# Patient Record
Sex: Male | Born: 1955 | Race: White | Hispanic: No | Smoking: Never smoker
Health system: Southern US, Community
[De-identification: ages and names within clinical notes are randomized; demographics above are authoritative.]

## PROBLEM LIST (undated history)

## (undated) DIAGNOSIS — I4891 Unspecified atrial fibrillation: Secondary | ICD-10-CM

## (undated) DIAGNOSIS — I10 Essential (primary) hypertension: Secondary | ICD-10-CM

## (undated) DIAGNOSIS — E785 Hyperlipidemia, unspecified: Secondary | ICD-10-CM

## (undated) DIAGNOSIS — G4733 Obstructive sleep apnea (adult) (pediatric): Secondary | ICD-10-CM

## (undated) HISTORY — DX: Hyperlipidemia, unspecified: E78.5

## (undated) HISTORY — DX: Essential (primary) hypertension: I10

## (undated) HISTORY — PX: TONSILLECTOMY: SUR1361

## (undated) HISTORY — DX: Unspecified atrial fibrillation: I48.91

## (undated) HISTORY — DX: Obstructive sleep apnea (adult) (pediatric): G47.33

---

## 2011-05-19 ENCOUNTER — Emergency Department (HOSPITAL_COMMUNITY): Payer: Federal, State, Local not specified - PPO

## 2011-05-19 ENCOUNTER — Encounter: Payer: Self-pay | Admitting: Emergency Medicine

## 2011-05-19 ENCOUNTER — Inpatient Hospital Stay (HOSPITAL_COMMUNITY)
Admission: EM | Admit: 2011-05-19 | Discharge: 2011-05-21 | DRG: 139 | Disposition: A | Discharge status: Home / Self Care | Payer: Federal, State, Local not specified - PPO | Attending: Internal Medicine | Admitting: Internal Medicine

## 2011-05-19 ENCOUNTER — Other Ambulatory Visit: Payer: Self-pay

## 2011-05-19 DIAGNOSIS — I10 Essential (primary) hypertension: Secondary | ICD-10-CM | POA: Diagnosis present

## 2011-05-19 DIAGNOSIS — E7800 Pure hypercholesterolemia, unspecified: Secondary | ICD-10-CM | POA: Diagnosis present

## 2011-05-19 DIAGNOSIS — I4891 Unspecified atrial fibrillation: Secondary | ICD-10-CM | POA: Diagnosis present

## 2011-05-19 DIAGNOSIS — Z7982 Long term (current) use of aspirin: Secondary | ICD-10-CM

## 2011-05-19 DIAGNOSIS — E785 Hyperlipidemia, unspecified: Secondary | ICD-10-CM

## 2011-05-19 DIAGNOSIS — Z79899 Other long term (current) drug therapy: Secondary | ICD-10-CM

## 2011-05-19 LAB — POCT I-STAT TROPONIN I: Troponin i, poc: 0 ng/mL (ref 0.00–0.08)

## 2011-05-19 LAB — BASIC METABOLIC PANEL
CO2: 25 mEq/L (ref 19–32)
Chloride: 101 mEq/L (ref 96–112)
Glucose, Bld: 107 mg/dL — ABNORMAL HIGH (ref 70–99)
Potassium: 4.6 mEq/L (ref 3.5–5.1)
Sodium: 136 mEq/L (ref 135–145)

## 2011-05-19 LAB — DIFFERENTIAL
Eosinophils Relative: 1 % (ref 0–5)
Lymphocytes Relative: 31 % (ref 12–46)
Lymphs Abs: 2.3 10*3/uL (ref 0.7–4.0)
Neutrophils Relative %: 59 % (ref 43–77)

## 2011-05-19 LAB — CBC
MCV: 85.2 fL (ref 78.0–100.0)
Platelets: 251 10*3/uL (ref 150–400)
RBC: 4.88 MIL/uL (ref 4.22–5.81)
WBC: 7.5 10*3/uL (ref 4.0–10.5)

## 2011-05-19 MED ORDER — LISINOPRIL 20 MG PO TABS
20.0000 mg | ORAL_TABLET | Freq: Every day | ORAL | Status: DC
  Administered 2011-05-19 – 2011-05-21 (×3): 20 mg via ORAL
  Filled 2011-05-19 (×4): qty 1

## 2011-05-19 MED ORDER — SODIUM CHLORIDE 0.9 % IV BOLUS (SEPSIS)
500.0000 mL | INTRAVENOUS | Status: AC
  Administered 2011-05-19: 1000 mL via INTRAVENOUS

## 2011-05-19 MED ORDER — NITROGLYCERIN 0.4 MG SL SUBL: 0.4000 mg | SUBLINGUAL_TABLET | SUBLINGUAL | Status: DC | PRN

## 2011-05-19 MED ORDER — SODIUM CHLORIDE 0.9 % IJ SOLN
3.0000 mL | Freq: Two times a day (BID) | INTRAMUSCULAR | Status: DC
  Administered 2011-05-19 – 2011-05-21 (×3): 3 mL via INTRAVENOUS

## 2011-05-19 MED ORDER — HYDROCHLOROTHIAZIDE 12.5 MG PO CAPS
12.5000 mg | ORAL_CAPSULE | Freq: Every day | ORAL | Status: DC
  Administered 2011-05-19 – 2011-05-21 (×3): 12.5 mg via ORAL
  Filled 2011-05-19 (×4): qty 1

## 2011-05-19 MED ORDER — DILTIAZEM HCL 60 MG PO TABS
60.0000 mg | ORAL_TABLET | Freq: Four times a day (QID) | ORAL | Status: DC
  Administered 2011-05-20 – 2011-05-21 (×7): 60 mg via ORAL
  Filled 2011-05-19 (×12): qty 1

## 2011-05-19 MED ORDER — ACETAMINOPHEN 325 MG PO TABS: 650.0000 mg | ORAL_TABLET | ORAL | Status: DC | PRN

## 2011-05-19 MED ORDER — DILTIAZEM HCL 60 MG PO TABS
60.0000 mg | ORAL_TABLET | Freq: Once | ORAL | Status: AC
  Administered 2011-05-19: 60 mg via ORAL
  Filled 2011-05-19: qty 1

## 2011-05-19 MED ORDER — HEPARIN SODIUM (PORCINE) 5000 UNIT/ML IJ SOLN
5000.0000 [IU] | Freq: Three times a day (TID) | INTRAMUSCULAR | Status: DC
  Administered 2011-05-20 – 2011-05-21 (×5): 5000 [IU] via SUBCUTANEOUS
  Filled 2011-05-19 (×9): qty 1

## 2011-05-19 MED ORDER — SODIUM CHLORIDE 0.9 % IJ SOLN: 3.0000 mL | INTRAMUSCULAR | Status: DC | PRN

## 2011-05-19 MED ORDER — METOPROLOL TARTRATE 1 MG/ML IV SOLN: 5.0000 mg | INTRAVENOUS | Status: DC | PRN

## 2011-05-19 MED ORDER — DILTIAZEM HCL 25 MG/5ML IV SOLN
15.0000 mg | Freq: Once | INTRAVENOUS | Status: AC
  Administered 2011-05-19: 15 mg via INTRAVENOUS
  Filled 2011-05-19: qty 5

## 2011-05-19 MED ORDER — DILTIAZEM HCL 100 MG IV SOLR
5.0000 mg/h | Freq: Once | INTRAVENOUS | Status: AC
  Administered 2011-05-19: 5 mg/h via INTRAVENOUS
  Filled 2011-05-19: qty 100

## 2011-05-19 MED ORDER — ONDANSETRON HCL 4 MG/2ML IJ SOLN: 4.0000 mg | Freq: Four times a day (QID) | INTRAMUSCULAR | Status: DC | PRN

## 2011-05-19 MED ORDER — ASPIRIN 325 MG PO TABS
325.0000 mg | ORAL_TABLET | Freq: Every day | ORAL | Status: DC
  Administered 2011-05-20 – 2011-05-21 (×3): 325 mg via ORAL
  Filled 2011-05-19 (×4): qty 1

## 2011-05-19 MED ORDER — OFF THE BEAT BOOK
Freq: Once | Status: AC
  Administered 2011-05-19
  Filled 2011-05-19: qty 1

## 2011-05-19 MED ORDER — HEPARIN BOLUS VIA INFUSION
4000.0000 [IU] | Freq: Once | INTRAVENOUS | Status: AC
  Administered 2011-05-19: 4000 [IU] via INTRAVENOUS

## 2011-05-19 MED ORDER — HEPARIN (PORCINE) IN NACL 100-0.45 UNIT/ML-% IJ SOLN
1500.0000 [IU]/h | INTRAMUSCULAR | Status: DC
  Filled 2011-05-19 (×2): qty 250

## 2011-05-19 MED ORDER — HEPARIN BOLUS VIA INFUSION
1000.0000 [IU] | INTRAVENOUS | Status: AC
  Administered 2011-05-19: 1000 [IU] via INTRAVENOUS
  Filled 2011-05-19: qty 1000

## 2011-05-19 MED ORDER — HEPARIN SOD (PORCINE) IN D5W 100 UNIT/ML IV SOLN
INTRAVENOUS | Status: AC
  Administered 2011-05-20: 08:00:00
  Filled 2011-05-19: qty 250

## 2011-05-19 NOTE — H&P (Signed)
History and Physical  Patient ID: Charles Mills MRN: [id number], SOB: [birth year] 55 y.o. Date of Encounter: 05/19/2011, 3:53 PM  Primary Physician: Barbera Setters, PA-C @ [facility] at [facility] Primary Cardiologist: New to [facility], Seen by Dr. Gala Romney today.  Chief Complaint: Atrial fibrillation on EKG at PCP Reason for Admission: Atrial fibrillation with RVR  HPI: 55yom with no prior medical history who presented to [facility] ED after an EKG at his primary care office showed atrial fibrillation with RVR.  Last Wednesday he was at the oral surgeon and his BP was found to be elevated. He hasn't seen a medical provider in the last 15 to 20 years, so he made an appointment to be seen at [facility] in [place]. He was seen there this morning and his BP was 170/102 and he was found to have an irregular heart rate on physical exam so an EKG was performed. The EKG revealed atrial fibrillation with RVR, HR 120s, and was sent by ambulance to the [facility] ED.  He denies any palpitations, chest pain, shortness of breath, recent illness, fever, chills, abdominal pain, change in bladder or bowels, weight change, calf pain or swelling or drug use. He is very active and walks/runs ~30miles a day 3-4 times a week as well as plays racquetball 3 times a week. He drinks an occasional margarita on the weekends and drinks two cups of coffee and ~32oz of green tea per day. He traveled by car over thanksgiving to [place], which is a two hour trip. He does snore, but is unsure if he stops breathing.  In the ED his EKG revealed atrial fibrillation with RVR, without acute ischemic changes. His initial troponin was negative, CXR was without acute cardiopulmonary findings, and his electrolytes were within normal limits. He was given a Diltiazem bolus followed by a drip as well as placed on a Heparin drip. He remained asymptomatic and his heart rate was in the 90s-110s.    History reviewed. No  pertinent past medical history.   Surgical History: Tonsillectomy in childhood.  Home Meds: Medication Sig  aspirin 81 MG chewable tablet Chew 81 mg by mouth at bedtime.    Calcium Carbonate (CALCIUM 600 PO) Take 600 mg by mouth 2 (two) times daily.    Cholecalciferol (VITAMIN D) 2000 UNITS CAPS Take 1 capsule by mouth 2 (two) times daily.    Coenzyme Q10 (CO Q 10) 100 MG CAPS Take 100 mg by mouth every morning.    cyanocobalamin 500 MCG tablet Take 500 mcg by mouth every morning.    GLUCOSAMINE PO Take 750 mg by mouth every morning.    Misc Natural Products (GINSENG-SIBERIAN) 648 MG CAPS Take 1 capsule by mouth every morning.    Omega-3 Fatty Acids (FISH OIL) 1200 MG CAPS Take 1 capsule by mouth 2 (two) times daily.    OVER THE COUNTER MEDICATION Place 2 drops into both eyes every morning. Murine Eye Drops   Potassium 99 MG TABS Take 99 mg by mouth at bedtime.    Probiotic Product (PROBIOTIC PO) Take 1 tablet by mouth every morning.    QUERCETIN PO Take 500 mg by mouth every morning.    Saw Palmetto, Serenoa repens, (SAW PALMETTO BERRIES) 540 MG CAPS Take 1 capsule by mouth 2 (two) times daily.    vitamin C (ASCORBIC ACID) 500 MG tablet Take 500 mg by mouth 2 (two) times daily.    vitamin E 400 UNIT capsule Take 400 Units by mouth 2 (two)  times daily.     Allergies: No Known Allergies  History   Social History  . Marital Status: Single   Occupational History  . 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 Smoking status: Never Smoker   . Smokeless tobacco: Not on file  . Alcohol Use: Yes     occasional margarita on the weekends  . Drug Use: No     History reviewed. No pertinent family history.  Review of Systems: General: negative for chills, fever, night sweats or weight changes.  Cardiovascular: negative for chest pain, palpitations, dyspnea on exertion, edema, orthopnea, paroxysmal nocturnal dyspnea or shortness of breath Dermatological: negative for  rash Respiratory: negative for cough or wheezing Urologic: negative for hematuria Abdominal: negative for nausea, vomiting, diarrhea, bright red blood per rectum, melena, or hematemesis Neurologic: negative for visual changes, syncope, or dizziness All other systems reviewed and are otherwise negative except as noted above.  Labs:  Lab Results  Component Value Date   WBC 7.5 05/19/2011   HGB 14.9 05/19/2011   HCT 41.6 05/19/2011   MCV 85.2 05/19/2011   PLT 251 05/19/2011    Lab 05/19/11 1236  NA 136  K 4.6  CL 101  CO2 25  BUN 16  CREATININE 0.92  CALCIUM 9.8  GLUCOSE 107*    05/19/2011 15:39  Troponin i, poc 0.00   Radiology/Studies:  Dg Chest Port 1 View 05/19/2011  Findings: The heart size and mediastinal contours are within normal limits.  Both lungs are clear.  The visualized skeletal structures are unremarkable.  IMPRESSION: No active cardiopulmonary abnormalities.     EKG: 05/19/11 @ 1202 - Atrial fibrillation, 144bpm  Physical Exam: Blood pressure 161/83, pulse 99, temperature 99.1 F (37.3 C), temperature source Oral, resp. rate 18, height 5\' 10"  (1.778 m), weight 110.224 kg (243 lb), SpO2 97.00%. General: Well developed, well nourished, overweight white male, in no acute distress. Head: Normocephalic, atraumatic, sclera non-icteric, no xanthomas, nares are without discharge.  Neck: Supple. Negative for carotid bruits. JVD not elevated. Lungs: Clear bilaterally to auscultation without wheezes, rales, or rhonchi. Breathing is unlabored. Heart: Irregular rate and rhythm with S1 S2. No murmurs, rubs, or gallops appreciated. Abdomen: Soft, non-tender, non-distended with normoactive bowel sounds. No rebound/guarding. No obvious abdominal masses. Msk:  Strength and tone appear normal for age. Extremities: No calf swelling or tenderness. Trace bilateral lower extremity edema. No clubbing or cyanosis. Distal pedal pulses are 2+ and equal bilaterally. Neuro: Alert and oriented X  3. Moves all extremities spontaneously. Psych:  Responds to questions appropriately with a normal affect.    ASSESSMENT AND PLAN:  55yom with no prior medical history who presented to [facility] ED after an EKG at his primary care office showed atrial fibrillation with RVR. He will be admitted for further evaluation and treatment.  1. Atrial Fibrillation w/ RVR: The patient remains asymptomatic and is stable and rate controlled on cardizem. The etiology is likely 2/2 to his HTN and/or questionable sleep apnea.  - The cardizem drip will be switched to oral cardizem.  - Lopressor 5mg  IV PRN HR > 100bpm - His CHADS2 score is 1 (pending A1C and echo to assess LV function). He will need anticoagulation as an outpatient until he is therapeutic for approx 4 weeks, at which time it will be assessed if he needs cardioversion. He wishes to hold off on anticoagulation at this time so he can undergo tooth extraction, after which point he will start Coumadin.  -  Discontinue Heparin drip and increase ASA to 325mg  daily - A 2D echocardiogram will be ordered to assess LV function, valvular dysfunction, and atrial dilatation. - Check TSH, fasting lipids, A1C -  Sleep study as an outpatient is recommended 2. Anticoagulation: As above 3. Hypertension: His blood pressure has been elevated on multiple occasions and is currently 160s/100s.  - Cardizem as above - Add Lisinopril/HCTZ  Signed, HOPE, JESSICA PA-C 05/19/2011, 3:53 PM   Patient seen and examined with [facility]. We discussed all aspects of the encounter. I agree with the assessment and plan as stated above. This is a catch-up note. I saw Mr. Parrillo with Berton Mount at [facility] on 05/19/11. He is an obese male with probable undiagnosed HTN and OSA who presents from urgent care after being found to have asymptomatic AF with RVR and severe HTN when he presented to a dentist office for cracked teeth for which he needs extraction. We discussed the pathophysiology  of AF in detail with him and his family. For now, will focus on HR and BP control of AF. We will check echo and TSH. Once HR and BP controlled he can be discharged. We will hold coumadin for now until he has his dental work and then plan to initiate anti-coagulation with eye towards DC-CV in 4 weeks after therapeutic anti-coagulation. Will need outpatient sleep study.   Daaiyah Baumert BensimhonMD 1:33 AM

## 2011-05-19 NOTE — ED Provider Notes (Signed)
Patient relates he has   a broken tooth and saw an oral surgeon last week. They noted he had hypertension. He states he went to see his primary care doctor today because of the hypertension to be evaluated before surgery. On exam he was noted to have irregular heartbeat an EKG was done showing atrophic relation. Patient denies any chest pain shortness of breath lightheadedness. He states last week however one day he was playing racquetball and felt a little dizzy however because of his broken tooth he also had eaten hardly at all that day. He is unaware of palpitations. He states there's no family history of heart problems. Patient states he feels fine.  Patient is alert and cooperative in no distress. Heart exam shows a regular tachycardia with no gross murmur or gallop heard.  Medical screening examination/treatment/procedure(s) were conducted as a shared visit with non-physician practitioner(s) and myself.  I personally evaluated the patient during the encounter Devoria Albe, MD, Franz Dell, MD 05/19/11 [phone]

## 2011-05-19 NOTE — ED Notes (Signed)
Per EMS went to [facility], after oral surgeon told he had high BP. Was found to be in new onset a fib with RVR. Pt has been asymptomatic.

## 2011-05-19 NOTE — Progress Notes (Signed)
ANTICOAGULATION CONSULT NOTE - Initial Consult  Pharmacy Consult for Heparin Indication: atrial fibrillation  No Known Allergies  Patient Measurements: Height: 5\' 10"  (177.8 cm) Weight: 243 lb (110.224 kg) IBW/kg (Calculated) : 73    Vital Signs: Temp: 99.1 F (37.3 C) (12/03 1206) Temp src: Oral (12/03 1206) BP: 172/94 mmHg (12/03 1206) Pulse Rate: 119  (12/03 1206)  Labs:  Basename 05/19/11 1236  HGB 14.9  HCT 41.6  PLT 251  APTT --  LABPROT --  INR --  HEPARINUNFRC --  CREATININE 0.92  CKTOTAL --  CKMB --  TROPONINI --   Estimated Creatinine Clearance: 112.8 ml/min (by C-G formula based on Cr of 0.92).  Medical History: History reviewed. No pertinent past medical history.  Medications:  Calcium, Vitamin D, Coenzyme Q , glucosamine, ginseng, omega 3 fatty acids, potassium 99mg , probiotic, quercetin, saw palmetto berries, aspirin 81mg  , cyanocobalamin, vitamin C and E.  Assessment: 55yo male with chief complaint of irregular heart rate on EKG at MD's office. No chest pain or SOB.  To start IV heparin infusion for Afib.    Goal of Therapy: Heparin level = 0.3-0.7    Plan:  Heparin bolus 4000 units IV and drip @ 1000 units/hr started in the ED @ 13:51 today. Will give additional 1000 unit IV heparin bolus now and increase heparin drip rate to 1500 units/hr. Check 6hr heparin level and CBC. Daily AM heparin level and CBC while on heparin drip.   Arman Filter 05/19/2011,2:01 PM

## 2011-05-19 NOTE — ED Notes (Signed)
Heparin bolus and infulsion rates verified by Britta Mccreedy, RN.

## 2011-05-19 NOTE — ED Notes (Signed)
States first noticed palpitations last Wednesday.

## 2011-05-19 NOTE — ED Provider Notes (Signed)
See prior note   Ward Givens, MD 05/19/11 [phone]

## 2011-05-19 NOTE — ED Provider Notes (Signed)
History     CSN: [id number] Arrival date & time: 05/19/2011 11:48 AM   First MD Initiated Contact with Patient 05/19/11 1159      Chief Complaint  Patient presents with  . Atrial Fibrillation    irregular heart rate on ekg at his doctor's office, denies cp and sob.   HPI Patient presents to emergency room with complaints of palpitations. Reports that he was sent from his doctor's office for this issue. He was initially evaluated at [facility] family practice after an oral surgeon on that his blood pressure was too high. at that time they did an EKG that showed that he was in atrial fibrillation with RVR. Patient denies any cardiac history. Patient denies any chest pain or shortness of breath.  History reviewed. No pertinent past medical history.  History reviewed. No pertinent past surgical history.  History reviewed. No pertinent family history.  History  Substance Use Topics  . Smoking status: Never Smoker   . Smokeless tobacco: Not on file  . Alcohol Use: Yes     occassionally      Review of Systems  Constitutional: Negative for fever, chills, diaphoresis and appetite change.  HENT: Negative for neck pain.   Eyes: Negative for photophobia and visual disturbance.  Respiratory: Negative for cough, choking, chest tightness, shortness of breath and stridor.   Cardiovascular: Positive for palpitations. Negative for chest pain and leg swelling.  Gastrointestinal: Negative for nausea, vomiting and abdominal pain.  Genitourinary: Negative for flank pain.  Musculoskeletal: Negative for back pain.  Skin: Negative for rash.  Neurological: Negative for weakness and numbness.  All other systems reviewed and are negative.    Allergies  Review of patient's allergies indicates no known allergies.  Home Medications   Current Outpatient Rx  Name Route Sig Dispense Refill  . ASPIRIN 81 MG PO CHEW Oral Chew 81 mg by mouth at bedtime.      Marland Kitchen CALCIUM 600 PO Oral Take 600 mg by  mouth 2 (two) times daily.      Marland Kitchen VITAMIN D 2000 UNITS PO CAPS Oral Take 1 capsule by mouth 2 (two) times daily.      . CO Q 10 100 MG PO CAPS Oral Take 100 mg by mouth every morning.      Marland Kitchen CYANOCOBALAMIN 500 MCG PO TABS Oral Take 500 mcg by mouth every morning.      Marland Kitchen GLUCOSAMINE PO Oral Take 750 mg by mouth every morning.      Marland Kitchen GINSENG-SIBERIAN 648 MG PO CAPS Oral Take 1 capsule by mouth every morning.      Marland Kitchen FISH OIL 1200 MG PO CAPS Oral Take 1 capsule by mouth 2 (two) times daily.      Marland Kitchen OVER THE COUNTER MEDICATION Both Eyes Place 2 drops into both eyes every morning. Murine Eye Drops     . POTASSIUM 99 MG PO TABS Oral Take 99 mg by mouth at bedtime.      Marland Kitchen PROBIOTIC PO Oral Take 1 tablet by mouth every morning.      Marland Kitchen QUERCETIN PO Oral Take 500 mg by mouth every morning.      . SAW PALMETTO BERRIES 540 MG PO CAPS Oral Take 1 capsule by mouth 2 (two) times daily.      Marland Kitchen VITAMIN C 500 MG PO TABS Oral Take 500 mg by mouth 2 (two) times daily.      Marland Kitchen VITAMIN E 400 UNITS PO CAPS Oral Take 400 Units by mouth  2 (two) times daily.        BP 172/94  Pulse 119  Temp(Src) 99.1 F (37.3 C) (Oral)  Resp 18  Ht 5\' 10"  (1.778 m)  Wt 243 lb (110.224 kg)  BMI 34.87 kg/m2  SpO2 99%  Physical Exam  Nursing note and vitals reviewed. Constitutional: He is oriented to person, place, and time. He appears well-developed and well-nourished.  HENT:  Head: Normocephalic and atraumatic.  Eyes: EOM are normal. Pupils are equal, round, and reactive to light.  Neck: Normal range of motion. Neck supple. No JVD present. No tracheal deviation present. No thyromegaly present.  Cardiovascular: Intact distal pulses.  Exam reveals no gallop and no friction rub.   No murmur heard.      Irregular irregular rhythm.  Pulmonary/Chest: Effort normal and breath sounds normal. No stridor. No respiratory distress. He has no wheezes. He has no rales. He exhibits no tenderness.  Abdominal: Soft. Bowel sounds are normal.  He exhibits no distension. There is no tenderness.  Musculoskeletal: Normal range of motion. He exhibits no edema and no tenderness.  Lymphadenopathy:    He has no cervical adenopathy.  Neurological: He is alert and oriented to person, place, and time.  Skin: Skin is warm and dry. No rash noted. No erythema. No pallor.  Psychiatric: He has a normal mood and affect. His behavior is normal. Judgment and thought content normal.    ED Course  Procedures (including critical care time)  Patient seen and evaluated.  VSS reviewed. . Nursing notes reviewed. Discussed with and seen with dr. Lynelle Doctor, attending physician. Initial testing ordered. Will monitor the patient closely. They agree with the treatment plan and diagnosis. Heparin and cardizem started.   Results for orders placed during the hospital encounter of 05/19/11  CBC      Component Value Range   WBC 7.5  4.0 - 10.5 (K/uL)   RBC 4.88  4.22 - 5.81 (MIL/uL)   Hemoglobin 14.9  13.0 - 17.0 (g/dL)   HCT 16.1  09.6 - 04.5 (%)   MCV 85.2  78.0 - 100.0 (fL)   MCH 30.5  26.0 - 34.0 (pg)   MCHC 35.8  30.0 - 36.0 (g/dL)   RDW 40.9  81.1 - 91.4 (%)   Platelets 251  150 - 400 (K/uL)  DIFFERENTIAL      Component Value Range   Neutrophils Relative 59  43 - 77 (%)   Neutro Abs 4.5  1.7 - 7.7 (K/uL)   Lymphocytes Relative 31  12 - 46 (%)   Lymphs Abs 2.3  0.7 - 4.0 (K/uL)   Monocytes Relative 9  3 - 12 (%)   Monocytes Absolute 0.7  0.1 - 1.0 (K/uL)   Eosinophils Relative 1  0 - 5 (%)   Eosinophils Absolute 0.0  0.0 - 0.7 (K/uL)   Basophils Relative 1  0 - 1 (%)   Basophils Absolute 0.1  0.0 - 0.1 (K/uL)  BASIC METABOLIC PANEL      Component Value Range   Sodium 136  135 - 145 (mEq/L)   Potassium 4.6  3.5 - 5.1 (mEq/L)   Chloride 101  96 - 112 (mEq/L)   CO2 25  19 - 32 (mEq/L)   Glucose, Bld 107 (*) 70 - 99 (mg/dL)   BUN 16  6 - 23 (mg/dL)   Creatinine, Ser 7.82  0.50 - 1.35 (mg/dL)   Calcium 9.8  8.4 - 95.6 (mg/dL)   GFR calc non Af  Amer >  90  >90 (mL/min)   GFR calc Af Amer >90  >90 (mL/min)   Dg Chest Port 1 View  05/19/2011  *RADIOLOGY REPORT*  Clinical Data: Shortness of breath  PORTABLE CHEST - 1 VIEW  Comparison: None  Findings: The heart size and mediastinal contours are within normal limits.  Both lungs are clear.  The visualized skeletal structures are unremarkable.  IMPRESSION: No active cardiopulmonary abnormalities.  Original Report Authenticated By: Rosealee Albee, M.D.    3:01 PM discussed with [facility] who will see the patient.    Date: 05/19/2011  Rate: 144  Rhythm: atrial fibrillation and RVR  QRS Axis: normal  Intervals: QT prolonged  ST/T Wave abnormalities: nonspecific ST/T changes  Conduction Disutrbances:none  Narrative Interpretation:   Old EKG Reviewed: none available    MDM  New onset A-Fib     Demetrius Charity, PA 05/19/11 1504

## 2011-05-20 ENCOUNTER — Encounter (HOSPITAL_COMMUNITY): Payer: Self-pay | Admitting: *Deleted

## 2011-05-20 ENCOUNTER — Other Ambulatory Visit: Payer: Self-pay

## 2011-05-20 DIAGNOSIS — I4891 Unspecified atrial fibrillation: Principal | ICD-10-CM

## 2011-05-20 DIAGNOSIS — I369 Nonrheumatic tricuspid valve disorder, unspecified: Secondary | ICD-10-CM

## 2011-05-20 LAB — LIPID PANEL
LDL Cholesterol: 187 mg/dL — ABNORMAL HIGH (ref 0–99)
Triglycerides: 100 mg/dL (ref <150)

## 2011-05-20 LAB — CBC
HCT: 40.7 % (ref 39.0–52.0)
MCHC: 34.6 g/dL (ref 30.0–36.0)
MCV: 86.6 fL (ref 78.0–100.0)
RDW: 12.6 % (ref 11.5–15.5)

## 2011-05-20 LAB — BASIC METABOLIC PANEL
BUN: 14 mg/dL (ref 6–23)
Chloride: 102 mEq/L (ref 96–112)
Creatinine, Ser: 1.06 mg/dL (ref 0.50–1.35)
GFR calc Af Amer: 89 mL/min — ABNORMAL LOW (ref >90)

## 2011-05-20 LAB — TSH: TSH: 1.178 u[IU]/mL (ref 0.350–4.500)

## 2011-05-20 LAB — HEMOGLOBIN A1C: Hgb A1c MFr Bld: 5.6 % (ref <5.7)

## 2011-05-20 MED ORDER — METOPROLOL SUCCINATE ER 25 MG PO TB24
25.0000 mg | ORAL_TABLET | Freq: Every day | ORAL | Status: DC
  Administered 2011-05-20 – 2011-05-21 (×2): 25 mg via ORAL
  Filled 2011-05-20 (×2): qty 1

## 2011-05-20 MED ORDER — ROSUVASTATIN CALCIUM 40 MG PO TABS
40.0000 mg | ORAL_TABLET | Freq: Every day | ORAL | Status: DC
  Administered 2011-05-20 – 2011-05-21 (×2): 40 mg via ORAL
  Filled 2011-05-20 (×2): qty 1

## 2011-05-20 NOTE — Progress Notes (Signed)
*  PRELIMINARY RESULTS* Echocardiogram 2D Echocardiogram has been performed.  Charles Mills [facility] 05/20/2011, 1:53 PM

## 2011-05-20 NOTE — Progress Notes (Signed)
Subjective:  The patient feels well. He was not aware of his fast irregular heart rate yesterday when he went to see his PCP about recently discovered high BP noted by his dentist last week.  No chest pain.  Remains in atrial fib with controlled ventricular response on diltiazem. BP labile and high at times.  Objective:  Vital Signs in the last 24 hours: Temp:  [97.4 F (36.3 C)-98.8 F (37.1 C)] 97.4 F (36.3 C) (12/04 1323) Pulse Rate:  [74-99] 87  (12/04 1323) Resp:  [16-18] 18  (12/04 1323) BP: (118-161)/(76-106) 160/88 mmHg (12/04 1323) SpO2:  [97 %-99 %] 99 % (12/04 1323) Weight:  [238 lb 15.7 oz (108.4 kg)-242 lb 12.8 oz (110.133 kg)] 238 lb 15.7 oz (108.4 kg) (12/04 0500)  Intake/Output from previous day: 12/03 0701 - 12/04 0700 In: 38.4 [I.V.:38.4] Out: -  Intake/Output from this shift: Total I/O In: 480 [P.O.:480] Out: 575 [Urine:575]     . aspirin  325 mg Oral Daily  . diltiazem (CARDIZEM) infusion  5-15 mg/hr Intravenous Once  . diltiazem  60 mg Oral Q6H  . diltiazem  60 mg Oral Once  . heparin subcutaneous  5,000 Units Subcutaneous Q8H  . hydrochlorothiazide  12.5 mg Oral Daily  . lisinopril  20 mg Oral Daily  . off the beat book   Does not apply Once  . sodium chloride  3 mL Intravenous Q12H  . DISCONTD: heparin          . DISCONTD: heparin 1,500 Units/hr (05/19/11 1424)    Physical Exam: The patient appears to be in no distress.  Head and neck exam reveals that the pupils are equal and reactive.  The extraocular movements are full.  There is no scleral icterus.  Mouth and pharynx are benign.  No lymphadenopathy.  No carotid bruits.  The jugular venous pressure is normal.  Thyroid is not enlarged or tender.  Chest is clear to percussion and auscultation.  No rales or rhonchi.  Expansion of the chest is symmetrical.  Heart reveals no abnormal lift or heave.  First and second heart sounds are normal.  There is no murmur gallop rub or click. Rhythm is  irregular.  The abdomen is soft and nontender.  Bowel sounds are normoactive.  There is no hepatosplenomegaly or mass.  There are no abdominal bruits.  Extremities reveal no phlebitis or edema.  Pedal pulses are good.  There is no cyanosis or clubbing.  Neurologic exam is normal strength and no lateralizing weakness.  No sensory deficits.  Integument reveals no rash  Lab Results:  Basename 05/20/11 0547 05/19/11 1236  WBC 10.2 7.5  HGB 14.1 14.9  PLT 253 251    Basename 05/20/11 0547 05/19/11 1236  NA 140 136  K 4.2 4.6  CL 102 101  CO2 28 25  GLUCOSE 98 107*  BUN 14 16  CREATININE 1.06 0.92    Basename 05/19/11 1509  TROPONINI <0.30   Hepatic Function Panel No results found for this basename: PROT,ALBUMIN,AST,ALT,ALKPHOS,BILITOT,BILIDIR,IBILI in the last 72 hours  Basename 05/20/11 0547  CHOL 255*  LDL  187 No results found for this basename: PROTIME in the last 72 hours  Imaging: Imaging results have been reviewed.  Chest xray normal.  Cardiac Studies: 2D echo done, presently in processing, not yet available to read. Assessment/Plan:  Patient Active Problem List  Diagnoses  . Atrial fibrillation with RVR      Rate better controlled.  . Hypertension  Still inadequate control.  Will add low dose toprol 25 daily. Hypercholesterolemia.      Will add Crestor for LDL 187         LOS: 1 day    Cassell Clement 05/20/2011, 2:28 PM

## 2011-05-21 ENCOUNTER — Other Ambulatory Visit: Payer: Self-pay

## 2011-05-21 DIAGNOSIS — E785 Hyperlipidemia, unspecified: Secondary | ICD-10-CM

## 2011-05-21 MED ORDER — HYDROCHLOROTHIAZIDE 12.5 MG PO CAPS: 12.5000 mg | ORAL_CAPSULE | Freq: Every day | ORAL | Status: DC

## 2011-05-21 MED ORDER — ROSUVASTATIN CALCIUM 40 MG PO TABS: 40.0000 mg | ORAL_TABLET | Freq: Every day | ORAL | Status: DC

## 2011-05-21 MED ORDER — LISINOPRIL 20 MG PO TABS: 20.0000 mg | ORAL_TABLET | Freq: Every day | ORAL | Status: DC

## 2011-05-21 MED ORDER — METOPROLOL SUCCINATE ER 25 MG PO TB24: 25.0000 mg | ORAL_TABLET | Freq: Every day | ORAL | Status: DC

## 2011-05-21 MED ORDER — NITROGLYCERIN 0.4 MG SL SUBL: 0.4000 mg | SUBLINGUAL_TABLET | SUBLINGUAL | Status: DC | PRN

## 2011-05-21 MED ORDER — DILTIAZEM HCL ER COATED BEADS 240 MG PO TB24: 240.0000 mg | ORAL_TABLET | Freq: Every day | ORAL | Status: DC

## 2011-05-21 NOTE — Plan of Care (Signed)
Problem: Discharge Progression Outcomes Goal: Other Discharge Outcomes/Goals Outcome: Completed/Met Date Met:  05/21/11 Given med handouts for HCTZ, Lisinopril, Toprol, Crestor, and Nitro at discharge

## 2011-05-21 NOTE — Progress Notes (Signed)
Patient ID: Charles Mills, male   DOB: 07/05/1955, 55 y.o.   MRN: [id number] SUBJECTIVE:      The patient feels well.  His rate is being controlled and his blood pressure is being controlled.  His atrial fibrillation is newly diagnosed.  He needs to have dental work done.  The plan was for him to be admitted for rate control and blood pressure control.  He was then to have his dental work.  After that he is to be seen back in the office to be started on anticoagulation with plans for cardioversion.The patient did have a two-dimensional echo.  There is normal left ventricular function.  The left atrium is mildly moderately dilated at 47 mm.  Filed Vitals:   05/20/11 1323 05/20/11 2222 05/21/11 0408 05/21/11 0755  BP: 160/88 131/77 122/82 134/87  Pulse: 87 67 67   Temp: 97.4 F (36.3 C) 98.6 F (37 C) 98 F (36.7 C)   TempSrc: Oral Oral Oral   Resp: 18 16 14    Height:      Weight:   236 lb 1.8 oz (107.1 kg)   SpO2: 99% 98% 98%     Intake/Output Summary (Last 24 hours) at 05/21/11 0848 Last data filed at 05/21/11 0500  Gross per 24 hour  Intake    800 ml  Output   1650 ml  Net   -850 ml    LABS: Basic Metabolic Panel:  Basename 05/20/11 0547 05/19/11 2223 05/19/11 1236  NA 140 -- 136  K 4.2 -- 4.6  CL 102 -- 101  CO2 28 -- 25  GLUCOSE 98 -- 107*  BUN 14 -- 16  CREATININE 1.06 -- 0.92  CALCIUM 9.0 -- 9.8  MG -- 2.0 --  PHOS -- -- --   Liver Function Tests: No results found for this basename: AST:2,ALT:2,ALKPHOS:2,BILITOT:2,PROT:2,ALBUMIN:2 in the last 72 hours No results found for this basename: LIPASE:2,AMYLASE:2 in the last 72 hours CBC:  Basename 05/20/11 0547 05/19/11 1236  WBC 10.2 7.5  NEUTROABS -- 4.5  HGB 14.1 14.9  HCT 40.7 41.6  MCV 86.6 85.2  PLT 253 251   Cardiac Enzymes:  Basename 05/19/11 1509  CKTOTAL --  CKMB --  CKMBINDEX --  TROPONINI <0.30   BNP: No results found for this basename: POCBNP:3 in the last 72 hours D-Dimer: No results found  for this basename: DDIMER:2 in the last 72 hours Hemoglobin A1C:  Basename 05/19/11 2223  HGBA1C 5.6   Fasting Lipid Panel:  Basename 05/20/11 0547  CHOL 255*  HDL 48  LDLCALC 187*  TRIG 100  CHOLHDL 5.3  LDLDIRECT --   Thyroid Function Tests:  Basename 05/19/11 2223  TSH 1.178  T4TOTAL --  T3FREE --  THYROIDAB --    RADIOLOGY: Dg Chest Port 1 View  05/19/2011  *RADIOLOGY REPORT*  Clinical Data: Shortness of breath  PORTABLE CHEST - 1 VIEW  Comparison: None  Findings: The heart size and mediastinal contours are within normal limits.  Both lungs are clear.  The visualized skeletal structures are unremarkable.  IMPRESSION: No active cardiopulmonary abnormalities.  Original Report Authenticated By: Rosealee Albee, M.D.    PHYSICAL EXAM   Patient is oriented to person time and place.  Affect is normal.  Head is atraumatic.  There is no jugulovenous distention.  Lungs are clear.  Respiratory effort is unlabored.  Cardiac exam reveals S1-S2.  There are no clicks or significant murmurs.  The abdomen is soft there is no peripheral edema.  TELEMETRY:   I have personally reviewed to telemetry.  Uterosacral fibrillation with a controlled rate.   ASSESSMENT AND PLAN:  Active Problems:   Atrial fibrillation with RVR      The patient can be discharged on the medications today.  Cardizem 240 mg long-acting can be used.  He can proceed with his dental work next Monday.  He needs an appointment to be seen by Dr. Gala Romney in the office soon  after his dental work is done.  At that time there'll be further discussion about the choice of anticoagulant and about the planning for potential cardioversion  .  Hypertension   Blood pressure is under better control.   [name] 05/21/2011 8:48 AM

## 2011-05-21 NOTE — Progress Notes (Signed)
Pt d/c home at 1445 with instructions, r/x, and f/u appointments. Pt verbalized understanding of instructions, home with wife, escorted self out per request, with his daughter.  Ninetta Lights 05/21/2011 4:04 PM

## 2011-05-21 NOTE — Discharge Summary (Signed)
Discharge Summary   Patient ID: Charles Mills,  MRN: [id number], DOB/AGE: Nov 22, 1955 55 y.o.  Admit date: 05/19/2011 Discharge date: 05/21/2011  Discharge Diagnoses Principal Problem:  *Atrial fibrillation with RVR Active Problems:  Hypertension  Hyperlipidemia   Allergies No Known Allergies  Procedures:   2D echocardiogram without contrast Study Conclusions  - Left ventricle: The cavity size was normal. Systolic function was normal. The estimated ejection fraction was in the range of 55% to 65%. Wall motion was normal; there were no regional wall motion abnormalities. Doppler parameters are consistent with abnormal left ventricular relaxation - Left atrium: The atrium was moderately dilated. - Right atrium: The atrium was mildly dilated. - Atrial septum: No defect or patent foramen ovale was identified. ------------------------------------------------------------ Left ventricle: The cavity size was normal. Systolic function was normal. The estimated ejection fraction was in the range of 55% to 65%. Wall motion was normal; there were no regional wall motion abnormalities. Doppler parameters are consistent with abnormal left ventricular relaxation ------------------------------------------------------------ Aortic valve: Structurally normal valve. Cusp separation was normal. Doppler: Transvalvular velocity was within the normal range. There was no stenosis. No regurgitation. ------------------------------------------------------------ Aorta: The aorta was normal, not dilated, and non-diseased.  ------------------------------------------------------------ Mitral valve: Mildly thickened leaflets . Doppler: Trivial regurgitation. Peak gradient: 3mm Hg (D). ------------------------------------------------------------ Left atrium: The atrium was moderately dilated ------------------------------------------------------------ Atrial septum: No defect or patent foramen ovale was  identified. ------------------------------------------------------------ Right ventricle: The cavity size was normal. Wall thickness was normal. Systolic function was normal. ------------------------------------------------------------ Pulmonic valve: The valve appears to be grossly normal. ------------------------------------------------------------ Tricuspid valve: Structurally normal valve. Leaflet separation was normal. Doppler: Transvalvular velocity was within the normal range. Mild regurgitation. ------------------------------------------------------------ Pulmonary artery: Poorly visualized. ------------------------------------------------------------ Right atrium: The atrium was mildly dilated. ------------------------------------------------------------ Systemic veins: Inferior vena cava: The vessel was normal in size; the respirophasic diameter changes were in the normal range (= 50%); findings are consistent with normal central venous pressure. ------------------------------------------------------------ Post procedure conclusions Ascending Aorta: - The aorta was normal, not dilated, and non-diseased.  History of Present Illness   [name] is a 55 year old Caucasian male with no prior medical history who presented to [facility] ED 12/03 after EKG at his primary care office revealed atrial fibrillation with RVR. He had previously seen an oral surgeon to repair cracked tooth and his blood pressure was found to be elevated. The procedure was canceled and he was encouraged to followup with his PCP. While there his BP was 170/102, he was found to have an irregular heart rate on physical exam and EKG performed revealed atrial fibrillation with RVR, heart rate 120s and was taken by imbalance to [facility] ED.  He was asymptomatic upon presentation denying palpitations, chest pain, shortness of breath, recent illness, fever, chills, abdominal pain, calf pain or swelling or drug use. He  is very active and walks/runs ~27miles a day 3-4 times a week as well as plays racquetball 3 times a week. He drinks an occasional margarita on the weekends and drinks two cups of coffee and ~32oz of noncaffeinated green tea per day. He traveled by car over thanksgiving to [place], which is a two hour trip. He does snore, but is unsure if he stops breathing.  Hospital Course   In the ED his EKG revealed atrial fibrillation with RVR, without acute ischemic changes. His initial troponin was negative, CXR without acute cardiopulmonary findings, and electrolytes, TSH WNL. He was given a Diltiazem bolus followed by a drip as well as placed on a Heparin  drip. He remained asymptomatic with HR in the 90s-110s. He was admitted for rate and BP control.   During his admission he remained asymptomatic in atrial fibrillation with controlled ventricular response on diltiazem IV. Blood pressure remained elevated on HCTZ and Lisinopril, subsequently Toprol was added. He was found to have hyperlipidemia and Crestor was added. Today, he remains in atrial fibrillation with adequate rate and blood pressure control and will be discharged today on Diltiazem, Toprol PO for rate control in addition to HCTZ, Lisinopril for HTN, and Crestor for HL, and NTG SL PRN. The patient will continue with rescheduled dental procedure on 12/10 and will followup at the [facility] office thereafter for Coumadin initiation and INR checks. This is to be continued for 4 weeks in anticipation of DCCV if patient does not convert to NSR. Also, sleep study has been ordered as a potential etiology of the atrial fibrillation + RVR. If unremarkable, consider caffeine intake as possible source.  Discharge Vitals:  Blood pressure 127/91, pulse 67, temperature 98 F (36.7 C), temperature source Oral, resp. rate 14, height 5\' 10"  (1.778 m), weight 107.1 kg (236 lb 1.8 oz), SpO2 98.00%.   Weight change: -3.124 kg (-6 lb 14.2 oz)  Labs: Recent Labs  [facility]  05/20/11 0547 05/19/11 1236   WBC 10.2 7.5   HGB 14.1 14.9   HCT 40.7 41.6   MCV 86.6 85.2   PLT 253 251    Lab 05/20/11 0547 05/19/11 1236  NA 140 136  K 4.2 4.6  CL 102 101  CO2 28 25  BUN 14 16  CREATININE 1.06 0.92  CALCIUM 9.0 9.8  PROT -- --  BILITOT -- --  ALKPHOS -- --  ALT -- --  AST -- --  AMYLASE -- --  LIPASE -- --  GLUCOSE 98 107*   Recent Labs  Basename 05/19/11 2223   HGBA1C 5.6   Recent Labs  Basename 05/19/11 1509   CKTOTAL --   CKMB --   CKMBINDEX --   TROPONINI <0.30   No results found for this basename: POCBNP in the last 72 hours Recent Labs  Basename 05/20/11 0547   CHOL 255*   HDL 48   LDLCALC 187*   TRIG 100   CHOLHDL 5.3   LDLDIRECT --    Basename 05/19/11 2223  TSH 1.178  T4TOTAL --  T3FREE --  THYROIDAB --    Disposition:  Discharge Orders    Future Appointments: Provider: Department: Dept Phone: Center:   06/03/2011 10:00 AM Beatrice Lecher, PA [facility] [phone] [facility]     Future Orders Please Complete By Expires   Polysomnography 4 or more parameters   05/20/12   Scheduling Instructions:   Please call patient to schedule.   Comments:   Clinic to perform outpatient.   Questions: Responses:   Where should this test be performed: Other     Follow-up Information    Follow up with Tereso Newcomer, PA on 06/03/2011. (At 10:00AM. You will see Tereso Newcomer, PA-C for Dr. Gala Romney. )    Contact information:   [address] [place] [address] [phone]       Please follow up. (Sleep study ordered. They will call you with an appointment. )          Discharge Medications:  Current Discharge Medication List    START taking these medications   Details  diltiazem (CARDIZEM LA) 240 MG 24 hr tablet Take 1 tablet (240  mg total) by mouth daily. Qty: 30 tablet, Refills: 3    hydrochlorothiazide (MICROZIDE) 12.5 MG capsule Take 1 capsule (12.5 mg total) by mouth  daily. Qty: 30 capsule, Refills: 3    lisinopril (PRINIVIL,ZESTRIL) 20 MG tablet Take 1 tablet (20 mg total) by mouth daily. Qty: 30 tablet, Refills: 3    metoprolol succinate (TOPROL-XL) 25 MG 24 hr tablet Take 1 tablet (25 mg total) by mouth daily. Qty: 30 tablet, Refills: 3    nitroGLYCERIN (NITROSTAT) 0.4 MG SL tablet Place 1 tablet (0.4 mg total) under the tongue every 5 (five) minutes as needed for chest pain. Qty: 25 tablet, Refills: 1    rosuvastatin (CRESTOR) 40 MG tablet Take 1 tablet (40 mg total) by mouth daily. Qty: 30 tablet, Refills: 3      CONTINUE these medications which have NOT CHANGED   Details  aspirin 81 MG chewable tablet Chew 81 mg by mouth at bedtime.      Calcium Carbonate (CALCIUM 600 PO) Take 600 mg by mouth 2 (two) times daily.      Cholecalciferol (VITAMIN D) 2000 UNITS CAPS Take 1 capsule by mouth 2 (two) times daily.      Coenzyme Q10 (CO Q 10) 100 MG CAPS Take 100 mg by mouth every morning.      cyanocobalamin 500 MCG tablet Take 500 mcg by mouth every morning.      GLUCOSAMINE PO Take 750 mg by mouth every morning.      Misc Natural Products (GINSENG-SIBERIAN) 648 MG CAPS Take 1 capsule by mouth every morning.      Omega-3 Fatty Acids (FISH OIL) 1200 MG CAPS Take 1 capsule by mouth 2 (two) times daily.      OVER THE COUNTER MEDICATION Place 2 drops into both eyes every morning. Murine Eye Drops     Potassium 99 MG TABS Take 99 mg by mouth at bedtime.      Probiotic Product (PROBIOTIC PO) Take 1 tablet by mouth every morning.      QUERCETIN PO Take 500 mg by mouth every morning.      Saw Palmetto, Serenoa repens, (SAW PALMETTO BERRIES) 540 MG CAPS Take 1 capsule by mouth 2 (two) times daily.      vitamin C (ASCORBIC ACID) 500 MG tablet Take 500 mg by mouth 2 (two) times daily.      vitamin E 400 UNIT capsule Take 400 Units by mouth 2 (two) times daily.          Outstanding Labs/Studies: None  Duration of Discharge Encounter: 40  minutes including physician time.  Signed, Hurman Horn, PA-C 05/21/2011, 12:17 PM   Patient seen and examined. I agree with the assessment and plan as detailed above. See also my additional thoughts below.   Please refer to my progress note from today for additional information. [name], MD, [facility] 05/21/2011 1:59 PM

## 2011-05-26 ENCOUNTER — Telehealth: Payer: Self-pay | Admitting: Internal Medicine

## 2011-05-26 NOTE — Telephone Encounter (Signed)
Pt says the oral surgeon and pharmacist reviewed his medication list and okayed the vicodin and PCN. He wanted to "triple check". Okay to take medications. Mylo Red RN

## 2011-05-26 NOTE — Telephone Encounter (Signed)
Pt had oral surgery today and was given vicodan and PCN ,  Are these ok to take?

## 2011-06-03 ENCOUNTER — Encounter: Payer: Self-pay | Admitting: Physician Assistant

## 2011-06-03 ENCOUNTER — Ambulatory Visit (INDEPENDENT_AMBULATORY_CARE_PROVIDER_SITE_OTHER): Payer: Federal, State, Local not specified - PPO | Admitting: Physician Assistant

## 2011-06-03 VITALS — BP 135/91 | HR 112 | Ht 70.0 in | Wt 232.1 lb

## 2011-06-03 DIAGNOSIS — I4891 Unspecified atrial fibrillation: Secondary | ICD-10-CM

## 2011-06-03 DIAGNOSIS — R0609 Other forms of dyspnea: Secondary | ICD-10-CM

## 2011-06-03 DIAGNOSIS — R0989 Other specified symptoms and signs involving the circulatory and respiratory systems: Secondary | ICD-10-CM

## 2011-06-03 DIAGNOSIS — R0683 Snoring: Secondary | ICD-10-CM

## 2011-06-03 MED ORDER — METOPROLOL SUCCINATE ER 25 MG PO TB24: 25.0000 mg | ORAL_TABLET | Freq: Two times a day (BID) | ORAL | Status: DC

## 2011-06-03 MED ORDER — RIVAROXABAN 20 MG PO TABS: 20.0000 mg | ORAL_TABLET | Freq: Every day | ORAL | Status: DC

## 2011-06-03 NOTE — Assessment & Plan Note (Signed)
He needs an investigation for sleep apnea.  I will make sure his sleep test is rescheduled.

## 2011-06-03 NOTE — Assessment & Plan Note (Signed)
As noted, blood pressure is actually somewhat low.  Adjust medications as outlined.

## 2011-06-03 NOTE — Assessment & Plan Note (Signed)
Crestor recently started.  Arrange followup lipids and LFTs at next appointment.

## 2011-06-03 NOTE — Progress Notes (Signed)
[address] [place], [place]  [address] Phone: [phone] Fax:  [phone]  Date:  06/03/2011   Name:  Charles Mills       DOB:  1956-01-26 MRN:  [id number]  PCP:  Karmen Stabs, PA-C Primary Cardiologist:  Seen by Dr. Arvilla Meres in the hospital Primary Electrophysiologist:  None    History of Present Illness: Charles Mills is a 55 y.o. male who presents for post hospital follow up.  He was admitted 12/3-12/5 with atrial fibrillation with rapid ventricular rate of unknown duration.  He had originally presented to his PCP for elevated blood pressures.  ECG demonstrated atrial fibrillation and he was referred to the hospital.  He was placed on diltiazem and toprol for rate control.  His blood pressure medications were adjusted for better control.  He was asymptomatic with his atrial fibrillation.  Cholesterol was noted to be significantly elevated and a statin was added to his medical regimen (LDL 187).  Other labs: Hemoglobin 14.1, potassium 4.2, creatinine 1.06, hemoglobin A1c 5.6, troponin negative x1.  Chest x-ray unremarkable.  Echocardiogram 05/20/11: EF 55-65%, moderate LAE, mild RAE.  CHADS2 score is 1.  He was kept off anticoagulants so that he could have a dental procedure completed.  He was discharged to home with plans to follow up today to decide on anticoagulant therapy and elective cardioversion.  He was also set up for an outpatient sleep study to assess for sleep apnea.    Since discharge, he feels well.  He denies palpitations.  His blood pressure has been low.  He has recorded it as low as 80 systolic at home.  He feels lightheaded with this.  No syncope.  No Orthopnea, PND or edema.  No chest pain.  He denies any history of bleeding problems.  He denies a history of stroke, congestive heart failure or diabetes.  Past Medical History  Diagnosis Date  . HTN (hypertension)   . Atrial fibrillation     Echocardiogram 05/20/11: EF 55-65%, moderate  LAE, mild RAE    Current Outpatient Prescriptions  Medication Sig Dispense Refill  . aspirin 81 MG chewable tablet Chew 81 mg by mouth at bedtime.        . Calcium Carbonate (CALCIUM 600 PO) Take 600 mg by mouth 2 (two) times daily.        . Cholecalciferol (VITAMIN D) 2000 UNITS CAPS Take 1 capsule by mouth 2 (two) times daily.        . Coenzyme Q10 (CO Q 10) 100 MG CAPS Take 100 mg by mouth every morning.        . cyanocobalamin 500 MCG tablet Take 500 mcg by mouth every morning.        . diltiazem (CARDIZEM LA) 240 MG 24 hr tablet Take 1 tablet (240 mg total) by mouth daily.  30 tablet  3  . GLUCOSAMINE PO Take 750 mg by mouth every morning.        . hydrochlorothiazide (MICROZIDE) 12.5 MG capsule Take 1 capsule (12.5 mg total) by mouth daily.  30 capsule  3  . HYDROcodone-acetaminophen (VICODIN) 5-500 MG per tablet Take 1 tablet by mouth as needed.        Marland Kitchen lisinopril (PRINIVIL,ZESTRIL) 20 MG tablet Take 1 tablet (20 mg total) by mouth daily.  30 tablet  3  . metoprolol succinate (TOPROL-XL) 25 MG 24 hr tablet Take 1 tablet (25 mg total) by mouth daily.  30 tablet  3  . nitroGLYCERIN (  NITROSTAT) 0.4 MG SL tablet Place 1 tablet (0.4 mg total) under the tongue every 5 (five) minutes as needed for chest pain.  25 tablet  1  . Omega-3 Fatty Acids (FISH OIL) 1200 MG CAPS Take 1 capsule by mouth 2 (two) times daily.        Marland Kitchen OVER THE COUNTER MEDICATION Place 2 drops into both eyes every morning. Murine Eye Drops       . Potassium 99 MG TABS Take 99 mg by mouth at bedtime.        . Probiotic Product (PROBIOTIC PO) Take 1 tablet by mouth every morning.        Marland Kitchen QUERCETIN PO Take 500 mg by mouth every morning.        . rosuvastatin (CRESTOR) 40 MG tablet Take 1 tablet (40 mg total) by mouth daily.  30 tablet  3  . Saw Palmetto, Serenoa repens, (SAW PALMETTO BERRIES) 540 MG CAPS Take 1 capsule by mouth 2 (two) times daily.        . vitamin C (ASCORBIC ACID) 500 MG tablet Take 500 mg by mouth 2  (two) times daily.        . vitamin E 400 UNIT capsule Take 400 Units by mouth 2 (two) times daily.          Allergies: No Known Allergies  History  Substance Use Topics  . Smoking status: Never Smoker   . Smokeless tobacco: Not on file  . Alcohol Use: Yes     occassionally     ROS:  Please see the history of present illness.  He had to cancel his sleep test due to recent dental procedure.  All other systems reviewed and negative.   PHYSICAL EXAM: VS:  BP 135/91  Pulse 103  Ht 5\' 10"  (1.778 m)  Wt 232 lb 1.9 oz (105.289 kg)  BMI 33.31 kg/m2 Repeat blood pressure by me 112/70 Well nourished, well developed, in no acute distress HEENT: normal Neck: no JVD Cardiac:  normal S1, S2; Irregularly irregular; no murmur Lungs:  clear to auscultation bilaterally, no wheezing, rhonchi or rales Abd: soft, nontender, no hepatomegaly Ext: no edema Skin: warm and dry Neuro:  CNs 2-12 intact, no focal abnormalities noted  EKG:   Atrial fibrillation, heart rate 112, left axis deviation, nonspecific ST-T wave changes  ASSESSMENT AND PLAN:

## 2011-06-03 NOTE — Assessment & Plan Note (Addendum)
CHADS2 score is one with hypertension.  We will try to restore normal sinus rhythm by placing him on anticoagulation and proceeding with elective cardioversion after 3-4 weeks. I had a long discussion with the patient today regarding Coumadin, Xarelto and Pradaxa.  We discussed the risks and benefits of all medications.  The patient has normal renal function.  He has no history of bleeding problems.  I will place him on Xarelto 20 mg daily.  He can stop his aspirin once he starts.  I have asked him to discuss with his dentist tomorrow to make sure that it is safe to start Xarelto.  His heart rate is uncontrolled.  His blood pressure has been somewhat low.  I will discontinue his hydrochlorothiazide and potassium.  I will increase his Toprol to 25 mg twice a day.  He will need establishment with a cardiologist in our office.  I will set him up with EP (either Dr. Ladona Ridgel or Dr. Johney Frame).  I can see him back in 2 weeks to ensure that his heart rate is well controlled.  As long as he is tolerating his anticoagulant therapy, at that time, we can schedule his cardioversion.

## 2011-06-03 NOTE — Patient Instructions (Signed)
Your physician recommends that you schedule a follow-up appointment in: 2 weeks with Tereso Newcomer, PA-C and 5 weeks with Dr Ladona Ridgel Your physician has recommended you make the following change in your medication: STOP HCTZ , Potassium and Aspirin    INCREASE Metoprolol to twice daily and     START Xarelto 20 mg and dinner (Rivaroxaban)

## 2011-06-04 ENCOUNTER — Telehealth: Payer: Self-pay | Admitting: Physician Assistant

## 2011-06-04 NOTE — Telephone Encounter (Signed)
Will forward to Weston Brass, PharmD.

## 2011-06-04 NOTE — Telephone Encounter (Signed)
Spoke with pt.  Answered all of his questions about Xarelto.  He is going to start the medication today.

## 2011-06-04 NOTE — Telephone Encounter (Signed)
Pt was in yesterday and discussed xarelto , has some questions about it

## 2011-06-18 ENCOUNTER — Encounter: Payer: Self-pay | Admitting: Physician Assistant

## 2011-06-19 ENCOUNTER — Ambulatory Visit (INDEPENDENT_AMBULATORY_CARE_PROVIDER_SITE_OTHER): Payer: Federal, State, Local not specified - PPO | Admitting: Physician Assistant

## 2011-06-19 ENCOUNTER — Other Ambulatory Visit: Payer: Self-pay | Admitting: Physician Assistant

## 2011-06-19 ENCOUNTER — Encounter: Payer: Self-pay | Admitting: Physician Assistant

## 2011-06-19 DIAGNOSIS — I4891 Unspecified atrial fibrillation: Secondary | ICD-10-CM

## 2011-06-19 DIAGNOSIS — I10 Essential (primary) hypertension: Secondary | ICD-10-CM

## 2011-06-19 DIAGNOSIS — R0989 Other specified symptoms and signs involving the circulatory and respiratory systems: Secondary | ICD-10-CM

## 2011-06-19 DIAGNOSIS — E785 Hyperlipidemia, unspecified: Secondary | ICD-10-CM

## 2011-06-19 DIAGNOSIS — R0683 Snoring: Secondary | ICD-10-CM

## 2011-06-19 DIAGNOSIS — R0609 Other forms of dyspnea: Secondary | ICD-10-CM

## 2011-06-19 MED ORDER — LISINOPRIL 20 MG PO TABS: 10.0000 mg | ORAL_TABLET | Freq: Every day | ORAL | Status: DC

## 2011-06-19 MED ORDER — METOPROLOL SUCCINATE ER 25 MG PO TB24: ORAL_TABLET | ORAL | Status: DC

## 2011-06-19 NOTE — Assessment & Plan Note (Addendum)
arrange lipids and LFTs in 6-8 weeks.

## 2011-06-19 NOTE — Assessment & Plan Note (Signed)
I suspect his blood pressure is running somewhat low.  Adjust lisinopril as noted.

## 2011-06-19 NOTE — Patient Instructions (Signed)
Your physician has recommended you make the following change in your medication: START Metoprolol Succinate 25mg  in the morning and 50mg  in the evening, DECREASE Lisinopril to 20mg  take one-half tablet by mouth daily  Your physician has recommended that you have a Cardioversion (DCCV). Electrical Cardioversion uses a jolt of electricity to your heart either through paddles or wired patches attached to your chest. This is a controlled, usually prescheduled, procedure. Defibrillation is done under light anesthesia in the hospital, and you usually go home the day of the procedure. This is done to get your heart back into a normal rhythm. You are not awake for the procedure. Please see the instruction sheet given to you today.  Your physician recommends that you return for a FASTING lipid and liver profile in 6 WEEKS  Your physician has recommended that you have a sleep study. This test records several body functions during sleep, including: brain activity, eye movement, oxygen and carbon dioxide blood levels, heart rate and rhythm, breathing rate and rhythm, the flow of air through your mouth and nose, snoring, body muscle movements, and chest and belly movement.

## 2011-06-19 NOTE — Assessment & Plan Note (Signed)
Persistent.  He has been on xarelto since 12/19.  His heart rate is still somewhat uncontrolled.  I suspect he is having some episodes of hypotension.  I will decrease his lisinopril to 10 mg daily.  I will increase his Toprol to 25 mg in the morning and 50 mg in the evening.  I discussed case today with Dr. Valera Castle.  We will set him up for elective cardioversion on her after 07/02/11.  He will keep his Initial appointment with Dr. [name] as scheduled.

## 2011-06-19 NOTE — Assessment & Plan Note (Signed)
Schedule split night sleep test.

## 2011-06-19 NOTE — Progress Notes (Signed)
[address] [place], [place]  [address] Phone: [phone] Fax:  [phone]  Date:  06/19/2011   Name:  Charles Mills       DOB:  10-Oct-1955 MRN:  [id number]  PCP:  Karmen Stabs, PA-C Primary Cardiologist:  Seen by Dr. Arvilla Meres in the hospital but will be following with Dr. [name]  Primary Electrophysiologist:  None    History of Present Illness: Charles Mills is a 56 y.o. male who presents for follow up.  He was admitted 12/3-12/5 with atrial fibrillation with rapid ventricular rate of unknown duration.  He was asymptomatic with his atrial fibrillation.  Echocardiogram 05/20/11: EF 55-65%, moderate LAE, mild RAE.  CHADS2 score is 1.  He was kept off anticoagulants so that he could have a dental procedure completed. He was also set up for an outpatient sleep study to assess for sleep apnea.  He was seen by me 12/18 and placed on Xarelto.  I also increased his Torprol to 25 mg bid for better rate control.  The plan is to proceed with DCCV once he has been on adequate anticoagulation.  Overall, doing well.  He has had some lightheadedness at home.  He notes his blood pressures have been quite low when he feels this way.  We checked his blood pressure machine in the office today.  His blood pressure machine obtained a reading of 139/113.  When I checked his blood pressure, it was 112/80.  He denies palpitations, chest pain, shortness of breath, syncope.  He is tolerating Xarelto.  Past Medical History  Diagnosis Date  . HTN (hypertension)   . Campath-induced atrial fibrillation     Echocardiogram 05/20/11: EF 55-65%, moderate LAE, mild RAE    Current Outpatient Prescriptions  Medication Sig Dispense Refill  . Calcium Carbonate (CALCIUM 600 PO) Take 600 mg by mouth 2 (two) times daily.        . Cholecalciferol (VITAMIN D) 2000 UNITS CAPS Take 1 capsule by mouth 2 (two) times daily.        . Coenzyme Q10 (CO Q 10) 100 MG CAPS Take 100 mg by mouth  every morning.        . cyanocobalamin 500 MCG tablet Take 500 mcg by mouth every morning.        . diltiazem (CARDIZEM LA) 240 MG 24 hr tablet Take 1 tablet (240 mg total) by mouth daily.  30 tablet  3  . lisinopril (PRINIVIL,ZESTRIL) 20 MG tablet Take 1 tablet (20 mg total) by mouth daily.  30 tablet  3  . metoprolol succinate (TOPROL-XL) 25 MG 24 hr tablet Take 1 tablet (25 mg total) by mouth 2 (two) times daily.  60 tablet  3  . nitroGLYCERIN (NITROSTAT) 0.4 MG SL tablet Place 1 tablet (0.4 mg total) under the tongue every 5 (five) minutes as needed for chest pain.  25 tablet  1  . Omega-3 Fatty Acids (FISH OIL) 1200 MG CAPS Take 1 capsule by mouth 2 (two) times daily.        Marland Kitchen OVER THE COUNTER MEDICATION Place 2 drops into both eyes every morning. Murine Eye Drops       . Probiotic Product (PROBIOTIC PO) Take 1 tablet by mouth every morning.        Marland Kitchen QUERCETIN PO Take 500 mg by mouth every morning.        . Rivaroxaban (XARELTO) 20 MG TABS Take 20 mg by mouth daily with supper.  30 tablet  3  . rosuvastatin (CRESTOR) 40 MG tablet Take 1 tablet (40 mg total) by mouth daily.  30 tablet  3  . Saw Palmetto, Serenoa repens, (SAW PALMETTO BERRIES) 540 MG CAPS Take 1 capsule by mouth 2 (two) times daily.        . vitamin C (ASCORBIC ACID) 500 MG tablet Take 500 mg by mouth 2 (two) times daily.        . vitamin E 400 UNIT capsule Take 400 Units by mouth 2 (two) times daily.        Marland Kitchen GLUCOSAMINE PO Take 750 mg by mouth every morning.        Marland Kitchen HYDROcodone-acetaminophen (VICODIN) 5-500 MG per tablet Take 1 tablet by mouth as needed.          Allergies: No Known Allergies  History  Substance Use Topics  . Smoking status: Never Smoker   . Smokeless tobacco: Not on file  . Alcohol Use: Yes     occassionally     ROS:  Please see the history of present illness.  All other systems reviewed and negative.   PHYSICAL EXAM: VS:  BP 140/88  Pulse 87  Resp 18  Ht 5\' 10"  (1.778 m)  Wt 228 lb  (103.42 kg)  BMI 32.71 kg/m2 Repeat blood pressure by me 112/80 Well nourished, well developed, in no acute distress HEENT: normal Neck: no JVD Cardiac:  normal S1, S2; Irregularly irregular; no murmur Lungs:  clear to auscultation bilaterally, no wheezing, rhonchi or rales Abd: soft, nontender, no hepatomegaly Ext: no edema Skin: warm and dry Neuro:  CNs 2-12 intact, no focal abnormalities noted  EKG:   Atrial fibrillation, heart rate 102, normal axis, nonspecific ST-T wave changes  ASSESSMENT AND PLAN:

## 2011-06-20 ENCOUNTER — Encounter (HOSPITAL_COMMUNITY): Payer: Self-pay

## 2011-06-25 ENCOUNTER — Other Ambulatory Visit (INDEPENDENT_AMBULATORY_CARE_PROVIDER_SITE_OTHER): Payer: Federal, State, Local not specified - PPO | Admitting: *Deleted

## 2011-06-25 ENCOUNTER — Ambulatory Visit (HOSPITAL_BASED_OUTPATIENT_CLINIC_OR_DEPARTMENT_OTHER): Payer: Federal, State, Local not specified - PPO | Attending: Physician Assistant | Admitting: Radiology

## 2011-06-25 VITALS — Ht 70.0 in | Wt 220.0 lb

## 2011-06-25 DIAGNOSIS — I4891 Unspecified atrial fibrillation: Secondary | ICD-10-CM

## 2011-06-25 DIAGNOSIS — I10 Essential (primary) hypertension: Secondary | ICD-10-CM

## 2011-06-25 DIAGNOSIS — G4733 Obstructive sleep apnea (adult) (pediatric): Secondary | ICD-10-CM

## 2011-06-25 DIAGNOSIS — I4949 Other premature depolarization: Secondary | ICD-10-CM | POA: Insufficient documentation

## 2011-06-25 LAB — BASIC METABOLIC PANEL
CO2: 28 mEq/L (ref 19–32)
Chloride: 104 mEq/L (ref 96–112)
Potassium: 4.6 mEq/L (ref 3.5–5.1)
Sodium: 139 mEq/L (ref 135–145)

## 2011-06-25 LAB — CBC WITH DIFFERENTIAL/PLATELET
Basophils Relative: 0.9 % (ref 0.0–3.0)
Eosinophils Relative: 1.7 % (ref 0.0–5.0)
HCT: 40.9 % (ref 39.0–52.0)
Hemoglobin: 13.9 g/dL (ref 13.0–17.0)
Lymphs Abs: 2.4 10*3/uL (ref 0.7–4.0)
MCV: 90.7 fl (ref 78.0–100.0)
Monocytes Absolute: 0.8 10*3/uL (ref 0.1–1.0)
Monocytes Relative: 14.1 % — ABNORMAL HIGH (ref 3.0–12.0)
Neutro Abs: 2.5 10*3/uL (ref 1.4–7.7)
RBC: 4.51 Mil/uL (ref 4.22–5.81)
WBC: 5.9 10*3/uL (ref 4.5–10.5)

## 2011-06-25 LAB — PROTIME-INR: INR: 1.3 ratio — ABNORMAL HIGH (ref 0.8–1.0)

## 2011-06-27 ENCOUNTER — Telehealth: Payer: Self-pay | Admitting: *Deleted

## 2011-06-27 NOTE — Telephone Encounter (Signed)
06/27/11--0930--PT NOTIFIED OF LAB RESULTS--NT

## 2011-07-01 DIAGNOSIS — G4733 Obstructive sleep apnea (adult) (pediatric): Secondary | ICD-10-CM

## 2011-07-01 DIAGNOSIS — I4891 Unspecified atrial fibrillation: Secondary | ICD-10-CM

## 2011-07-01 DIAGNOSIS — I4949 Other premature depolarization: Secondary | ICD-10-CM

## 2011-07-02 ENCOUNTER — Encounter (HOSPITAL_COMMUNITY)
Admission: RE | Disposition: A | Discharge status: Home / Self Care | Payer: Self-pay | Source: Ambulatory Visit | Attending: Cardiovascular Disease

## 2011-07-02 ENCOUNTER — Ambulatory Visit (HOSPITAL_COMMUNITY)
Admission: RE | Admit: 2011-07-02 | Discharge: 2011-07-02 | Disposition: A | Discharge status: Home / Self Care | Payer: Federal, State, Local not specified - PPO | Source: Ambulatory Visit | Attending: Cardiovascular Disease | Admitting: Cardiovascular Disease

## 2011-07-02 ENCOUNTER — Encounter (HOSPITAL_COMMUNITY): Payer: Self-pay | Admitting: Certified Registered"

## 2011-07-02 ENCOUNTER — Ambulatory Visit (HOSPITAL_COMMUNITY): Payer: Federal, State, Local not specified - PPO | Admitting: Certified Registered"

## 2011-07-02 ENCOUNTER — Other Ambulatory Visit: Payer: Self-pay

## 2011-07-02 DIAGNOSIS — I10 Essential (primary) hypertension: Secondary | ICD-10-CM | POA: Insufficient documentation

## 2011-07-02 DIAGNOSIS — I4891 Unspecified atrial fibrillation: Secondary | ICD-10-CM

## 2011-07-02 HISTORY — PX: CARDIOVERSION: SHX1299

## 2011-07-02 SURGERY — CARDIOVERSION
Anesthesia: General | Wound class: Clean

## 2011-07-02 MED ORDER — SODIUM CHLORIDE 0.9 % IJ SOLN: 3.0000 mL | Freq: Two times a day (BID) | INTRAMUSCULAR | Status: DC

## 2011-07-02 MED ORDER — HYDROCORTISONE 1 % EX CREA
1.0000 "application " | TOPICAL_CREAM | Freq: Three times a day (TID) | CUTANEOUS | Status: DC | PRN
  Filled 2011-07-02 (×2): qty 28

## 2011-07-02 MED ORDER — SODIUM CHLORIDE 0.9 % IJ SOLN: 3.0000 mL | INTRAMUSCULAR | Status: DC | PRN

## 2011-07-02 MED ORDER — SODIUM CHLORIDE 0.9 % IV SOLN: 250.0000 mL | INTRAVENOUS | Status: DC

## 2011-07-02 MED ORDER — PROPOFOL 10 MG/ML IV EMUL
INTRAVENOUS | Status: DC | PRN
  Administered 2011-07-02: 100 mg via INTRAVENOUS
  Administered 2011-07-02 (×2): 50 mg via INTRAVENOUS

## 2011-07-02 MED ORDER — SODIUM CHLORIDE 0.9 % IV SOLN
INTRAVENOUS | Status: DC | PRN
  Administered 2011-07-02: 12:00:00 via INTRAVENOUS

## 2011-07-02 NOTE — Op Note (Signed)
DCC: Indication: Rapid atrial fibrillation Anticoagulation: Xarelto for 3 weeks Anesthesia:  Propofol 200mg   DCC x1 150J biphasic Converted from afib at a rate of 133 to sinus rhythm rate 68  Impression Successful DCC Continue Xarelto 3 weeks  F/U Dr Sonnie Alamo Matthias Bogus 12:20 PM 07/02/2011

## 2011-07-02 NOTE — Anesthesia Postprocedure Evaluation (Signed)
  Anesthesia Post-op Note  Patient: Charles Mills  Procedure(s) Performed:  CARDIOVERSION  Patient Location: Short Stay  Anesthesia Type: General  Level of Consciousness: awake, oriented and patient cooperative  Airway and Oxygen Therapy: Patient Spontanous Breathing and Patient connected to nasal cannula oxygen  Post-op Pain: none  Post-op Assessment: Post-op Vital signs reviewed and Patient's Cardiovascular Status Stable  Post-op Vital Signs: Reviewed  Complications: No apparent anesthesia complications

## 2011-07-02 NOTE — H&P (View-Only) (Signed)
[address] [place], [place]  [address] Phone: [phone] Fax:  [phone]  Date:  06/19/2011   Name:  Charles Mills       DOB:  01/31/1956 MRN:  [id number]  PCP:  Kristin Kaplan, PA-C Primary Cardiologist:  Seen by Dr. Daniel Bensimhon in the hospital but will be following with Dr. Gregg Taylor  Primary Electrophysiologist:  None    History of Present Illness: Charles Mills is a 55 y.o. male who presents for follow up.  He was admitted 12/3-12/5 with atrial fibrillation with rapid ventricular rate of unknown duration.  He was asymptomatic with his atrial fibrillation.  Echocardiogram 05/20/11: EF 55-65%, moderate LAE, mild RAE.  CHADS2 score is 1.  He was kept off anticoagulants so that he could have a dental procedure completed. He was also set up for an outpatient sleep study to assess for sleep apnea.  He was seen by me 12/18 and placed on Xarelto.  I also increased his Torprol to 25 mg bid for better rate control.  The plan is to proceed with DCCV once he has been on adequate anticoagulation.  Overall, doing well.  He has had some lightheadedness at home.  He notes his blood pressures have been quite low when he feels this way.  We checked his blood pressure machine in the office today.  His blood pressure machine obtained a reading of 139/113.  When I checked his blood pressure, it was 112/80.  He denies palpitations, chest pain, shortness of breath, syncope.  He is tolerating Xarelto.  Past Medical History  Diagnosis Date  . HTN (hypertension)   . Campath-induced atrial fibrillation     Echocardiogram 05/20/11: EF 55-65%, moderate LAE, mild RAE    Current Outpatient Prescriptions  Medication Sig Dispense Refill  . Calcium Carbonate (CALCIUM 600 PO) Take 600 mg by mouth 2 (two) times daily.        . Cholecalciferol (VITAMIN D) 2000 UNITS CAPS Take 1 capsule by mouth 2 (two) times daily.        . Coenzyme Q10 (CO Q 10) 100 MG CAPS Take 100 mg by mouth  every morning.        . cyanocobalamin 500 MCG tablet Take 500 mcg by mouth every morning.        . diltiazem (CARDIZEM LA) 240 MG 24 hr tablet Take 1 tablet (240 mg total) by mouth daily.  30 tablet  3  . lisinopril (PRINIVIL,ZESTRIL) 20 MG tablet Take 1 tablet (20 mg total) by mouth daily.  30 tablet  3  . metoprolol succinate (TOPROL-XL) 25 MG 24 hr tablet Take 1 tablet (25 mg total) by mouth 2 (two) times daily.  60 tablet  3  . nitroGLYCERIN (NITROSTAT) 0.4 MG SL tablet Place 1 tablet (0.4 mg total) under the tongue every 5 (five) minutes as needed for chest pain.  25 tablet  1  . Omega-3 Fatty Acids (FISH OIL) 1200 MG CAPS Take 1 capsule by mouth 2 (two) times daily.        . OVER THE COUNTER MEDICATION Place 2 drops into both eyes every morning. Murine Eye Drops       . Probiotic Product (PROBIOTIC PO) Take 1 tablet by mouth every morning.        . QUERCETIN PO Take 500 mg by mouth every morning.        . Rivaroxaban (XARELTO) 20 MG TABS Take 20 mg by mouth daily with supper.  30 tablet    3  . rosuvastatin (CRESTOR) 40 MG tablet Take 1 tablet (40 mg total) by mouth daily.  30 tablet  3  . Saw Palmetto, Serenoa repens, (SAW PALMETTO BERRIES) 540 MG CAPS Take 1 capsule by mouth 2 (two) times daily.        . vitamin C (ASCORBIC ACID) 500 MG tablet Take 500 mg by mouth 2 (two) times daily.        . vitamin E 400 UNIT capsule Take 400 Units by mouth 2 (two) times daily.        . GLUCOSAMINE PO Take 750 mg by mouth every morning.        . HYDROcodone-acetaminophen (VICODIN) 5-500 MG per tablet Take 1 tablet by mouth as needed.          Allergies: No Known Allergies  History  Substance Use Topics  . Smoking status: Never Smoker   . Smokeless tobacco: Not on file  . Alcohol Use: Yes     occassionally     ROS:  Please see the history of present illness.  All other systems reviewed and negative.   PHYSICAL EXAM: VS:  BP 140/88  Pulse 87  Resp 18  Ht 5' 10" (1.778 m)  Wt 228 lb  (103.42 kg)  BMI 32.71 kg/m2 Repeat blood pressure by me 112/80 Well nourished, well developed, in no acute distress HEENT: normal Neck: no JVD Cardiac:  normal S1, S2; Irregularly irregular; no murmur Lungs:  clear to auscultation bilaterally, no wheezing, rhonchi or rales Abd: soft, nontender, no hepatomegaly Ext: no edema Skin: warm and dry Neuro:  CNs 2-12 intact, no focal abnormalities noted  EKG:   Atrial fibrillation, heart rate 102, normal axis, nonspecific ST-T wave changes  ASSESSMENT AND PLAN:  

## 2011-07-02 NOTE — Procedures (Signed)
Charles Mills, Charles NO.:  [id number]  MEDICAL RECORD NO.:  [id number]          PATIENT TYPE:  OUT  LOCATION:  SLEEP CENTER                 FACILITY:  [facility]  PHYSICIAN:  Barbaraann Share, MD,FCCPDATE OF BIRTH:  02-18-56  DATE OF STUDY:  06/25/2011                           NOCTURNAL POLYSOMNOGRAM  REFERRING PHYSICIAN:  Tereso Newcomer, PA-C  LOCATION:  Sleep lab.  REFERRING PHYSICIANS:  Tereso Newcomer, PA-C  INDICATION FOR STUDY:  Hypersomnia with sleep apnea.  EPWORTH SLEEPINESS SCORE:  2.  MEDICATIONS:  SLEEP ARCHITECTURE:  The patient had total sleep time of 318 minutes with very little slow wave sleep, but adequate quantity of REM. Sleep onset latency was normal at 16 minutes, and REM onset was prolonged at 195 minutes.  Sleep efficiency was moderately reduced at 80%.  RESPIRATORY DATA:  The patient was found to have 14 apneas and 42 obstructive hypopneas, giving him an apnea-hypopnea index of 11 events per hour.  The events occurred more frequently in the supine position and there was moderate snoring noted throughout.  The patient was also noted to have small-to-moderate numbers of decreased respiratory flow, that did result in arousals, but did not result in oxygen desaturation. Therefore, these could not be characterized as hypopnea.  OXYGEN DATA:  There was O2 desaturation as low as 88% with the patient's obstructive events.  CARDIAC DATA:  The patient was noted to have atrial fibrillation with a controlled ventricular response.  There was occasional PVC.  MOVEMENT-PARASOMNIA:  The patient was found to have 236 periodic limb movements with 0.9 per hour resulting in arousal or awakening.  IMPRESSIONS-RECOMMENDATIONS: 1. Mild obstructive sleep apnea/hypopnea syndrome with an AHI of 11     events per hour, and O2 desaturation as low as 88%.  The patient     also had small-to-moderate numbers of events characterized as     respiratory effort  related arousals.  Treatment for this degree of     sleep apnea can include a trial of weight loss alone, upper airway     surgery, dental appliance, and also CPAP.  The decision to treat     this degree of sleep apnea aggressively will depend upon its impact     to the patient's quality of life, and also whether it is believed     to strongly affect the patient's cardiac status.  Clinical     correlation is suggested. 2. Atrial fibrillation noted with controlled ventricular response.     There were occasional PVCs. 3. Large numbers of leg jerks with minimal sleep disruption.  This may     be associated with the patient's sleep-disordered breathing,     however, clinical correlation is suggested to evaluate for possible     primary movement disorder of sleep.     Barbaraann Share, MD,FCCP Diplomate, American Board of Sleep Medicine Electronically Signed    [facility]  D:  07/01/2011 08:28:05  T:  07/02/2011 02:13:42  Job:  [id number]

## 2011-07-02 NOTE — Brief Op Note (Signed)
See operative note [name] 12:15 PM 07/02/2011

## 2011-07-02 NOTE — Anesthesia Postprocedure Evaluation (Signed)
  Anesthesia Post-op Note  Patient: Charles Mills  Procedure(s) Performed:  CARDIOVERSION  Patient Location: Short Stay  Anesthesia Type: General  Level of Consciousness: awake  Airway and Oxygen Therapy: Patient Spontanous Breathing and Patient connected to nasal cannula oxygen  Post-op Pain: none  Post-op Assessment: Post-op Vital signs reviewed, Patient's Cardiovascular Status Stable, Respiratory Function Stable and Patent Airway  Post-op Vital Signs: Reviewed and stable  Complications: No apparent anesthesia complications

## 2011-07-02 NOTE — Anesthesia Preprocedure Evaluation (Addendum)
Anesthesia Evaluation  Patient identified by MRN, date of birth, ID band Patient awake    Reviewed: Allergy & Precautions, H&P , NPO status , Patient's Chart, lab work & pertinent test results  Airway Mallampati: II TM Distance: >3 FB Neck ROM: Full    Dental  (+) Teeth Intact   Pulmonary neg pulmonary ROS,  clear to auscultation        Cardiovascular hypertension, Pt. on medications and Pt. on home beta blockers + dysrhythmias Atrial Fibrillation Irregular     Neuro/Psych Negative Neurological ROS  Negative Psych ROS   GI/Hepatic negative GI ROS, Neg liver ROS,   Endo/Other  Negative Endocrine ROS  Renal/GU negative Renal ROS     Musculoskeletal negative musculoskeletal ROS (+)   Abdominal Normal abdominal exam  (+)   Peds  Hematology negative hematology ROS (+)   Anesthesia Other Findings   Reproductive/Obstetrics                           Anesthesia Physical Anesthesia Plan  ASA: III  Anesthesia Plan: General   Post-op Pain Management:    Induction: Intravenous  Airway Management Planned: Mask  Additional Equipment:   Intra-op Plan:   Post-operative Plan:   Informed Consent: I have reviewed the patients History and Physical, chart, labs and discussed the procedure including the risks, benefits and alternatives for the proposed anesthesia with the patient or authorized representative who has indicated his/her understanding and acceptance.     Plan Discussed with: Anesthesiologist  Anesthesia Plan Comments:        Anesthesia Quick Evaluation

## 2011-07-02 NOTE — Preoperative (Signed)
Beta Blockers   Reason not to administer Beta Blockers:Not Applicable, pt took Beta Blocker this am 

## 2011-07-02 NOTE — Transfer of Care (Signed)
Immediate Anesthesia Transfer of Care Note  Patient: Charles Mills  Procedure(s) Performed:  CARDIOVERSION  Patient Location: Short Stay  Anesthesia Type: General  Level of Consciousness: awake, oriented and patient cooperative  Airway & Oxygen Therapy: Patient Spontanous Breathing and Patient connected to nasal cannula oxygen  Post-op Assessment: Report given to PACU RN, Post -op Vital signs reviewed and stable and Patient moving all extremities  Post vital signs: Reviewed Filed Vitals:   07/02/11 1000  BP: 128/85  Pulse: 118  Temp: 36.6 C  Resp: 20    Complications: No apparent anesthesia complications

## 2011-07-02 NOTE — Interval H&P Note (Signed)
History and Physical Interval Note:  07/02/2011 12:15 PM  Charles Mills  has presented today for surgery, with the diagnosis of AFIB  The various methods of treatment have been discussed with the patient and family. After consideration of risks, benefits and other options for treatment, the patient has consented to  Procedure(s): CARDIOVERSION as a surgical intervention .  The patients' history has been reviewed, patient examined, no change in status, stable for surgery.  I have reviewed the patients' chart and labs.  Questions were answered to the patient's satisfaction.     [name] 12:15 PM 07/02/2011

## 2011-07-03 ENCOUNTER — Encounter (HOSPITAL_COMMUNITY): Payer: Self-pay | Admitting: Cardiovascular Disease

## 2011-07-04 NOTE — H&P (Signed)
Date: 06/19/2011   Name: Charles Mills  DOB: [birth year]  MRN: [id number]   PCP: Karmen Stabs, PA-C  Primary Cardiologist: Seen by Dr. Arvilla Meres in the hospital but will be following with Dr. [name]  Primary Electrophysiologist: None   History of Present Illness:  Charles Mills is a 56 y.o. male who presents for follow up. He was admitted 12/3-12/5 with atrial fibrillation with rapid ventricular rate of unknown duration. He was asymptomatic with his atrial fibrillation. Echocardiogram 05/20/11: EF 55-65%, moderate LAE, mild RAE. CHADS2 score is 1. He was kept off anticoagulants so that he could have a dental procedure completed. He was also set up for an outpatient sleep study to assess for sleep apnea. He was seen by me 12/18 and placed on Xarelto. I also increased his Torprol to 25 mg bid for better rate control. The plan is to proceed with DCCV once he has been on adequate anticoagulation.  Overall, doing well. He has had some lightheadedness at home. He notes his blood pressures have been quite low when he feels this way. We checked his blood pressure machine in the office today. His blood pressure machine obtained a reading of 139/113. When I checked his blood pressure, it was 112/80. He denies palpitations, chest pain, shortness of breath, syncope. He is tolerating Xarelto.   Past Medical History   Diagnosis  Date   .  HTN (hypertension)    .  Campath-induced atrial fibrillation      Echocardiogram 05/20/11: EF 55-65%, moderate LAE, mild RAE     Current Outpatient Prescriptions   Medication  Sig  Dispense  Refill   .  Calcium Carbonate (CALCIUM 600 PO)  Take 600 mg by mouth 2 (two) times daily.     .  Cholecalciferol (VITAMIN D) 2000 UNITS CAPS  Take 1 capsule by mouth 2 (two) times daily.     .  Coenzyme Q10 (CO Q 10) 100 MG CAPS  Take 100 mg by mouth every morning.     .  cyanocobalamin 500 MCG tablet  Take 500 mcg by mouth every morning.     .  diltiazem (CARDIZEM LA) 240 MG  24 hr tablet  Take 1 tablet (240 mg total) by mouth daily.  30 tablet  3   .  lisinopril (PRINIVIL,ZESTRIL) 20 MG tablet  Take 1 tablet (20 mg total) by mouth daily.  30 tablet  3   .  metoprolol succinate (TOPROL-XL) 25 MG 24 hr tablet  Take 1 tablet (25 mg total) by mouth 2 (two) times daily.  60 tablet  3   .  nitroGLYCERIN (NITROSTAT) 0.4 MG SL tablet  Place 1 tablet (0.4 mg total) under the tongue every 5 (five) minutes as needed for chest pain.  25 tablet  1   .  Omega-3 Fatty Acids (FISH OIL) 1200 MG CAPS  Take 1 capsule by mouth 2 (two) times daily.     Marland Kitchen  OVER THE COUNTER MEDICATION  Place 2 drops into both eyes every morning. Murine Eye Drops     .  Probiotic Product (PROBIOTIC PO)  Take 1 tablet by mouth every morning.     Marland Kitchen  QUERCETIN PO  Take 500 mg by mouth every morning.     .  Rivaroxaban (XARELTO) 20 MG TABS  Take 20 mg by mouth daily with supper.  30 tablet  3   .  rosuvastatin (CRESTOR) 40 MG tablet  Take 1 tablet (40 mg total) by mouth daily.  30 tablet  3   .  Saw Palmetto, Serenoa repens, (SAW PALMETTO BERRIES) 540 MG CAPS  Take 1 capsule by mouth 2 (two) times daily.     .  vitamin C (ASCORBIC ACID) 500 MG tablet  Take 500 mg by mouth 2 (two) times daily.     .  vitamin E 400 UNIT capsule  Take 400 Units by mouth 2 (two) times daily.     Marland Kitchen  GLUCOSAMINE PO  Take 750 mg by mouth every morning.     Marland Kitchen  HYDROcodone-acetaminophen (VICODIN) 5-500 MG per tablet  Take 1 tablet by mouth as needed.       Allergies:  No Known Allergies    History   Substance Use Topics   .  Smoking status:  Never Smoker   .  Smokeless tobacco:  Not on file   .  Alcohol Use:  Yes      occassionally     ROS: Please see the history of present illness. All other systems reviewed and negative.   PHYSICAL EXAM:  VS: BP 140/88  Pulse 87  Resp 18  Ht 5\' 10"  (1.778 m)  Wt 228 lb (103.42 kg)  BMI 32.71 kg/m2  Repeat blood pressure by me 112/80  Well nourished, well developed, in no acute  distress  HEENT: normal  Neck: no JVD  Cardiac: normal S1, S2; Irregularly irregular; no murmur  Lungs: clear to auscultation bilaterally, no wheezing, rhonchi or rales  Abd: soft, nontender, no hepatomegaly  Ext: no edema  Skin: warm and dry  Neuro: CNs 2-12 intact, no focal abnormalities noted   EKG: Atrial fibrillation, heart rate 102, normal axis, nonspecific ST-T wave changes   ASSESSMENT AND PLAN:   Atrial fibrillation - Tereso Newcomer, PA 06/19/2011 4:54 PM Signed  Persistent. He has been on xarelto since 12/19. His heart rate is still somewhat uncontrolled. I suspect he is having some episodes of hypotension. I will decrease his lisinopril to 10 mg daily. I will increase his Toprol to 25 mg in the morning and 50 mg in the evening. I discussed case today with Dr. Valera Castle. We will set him up for elective cardioversion on her after 07/02/11. He will keep his Initial appointment with Dr. [name] as scheduled.  Hypertension - Tereso Newcomer, [place] 06/19/2011 4:54 PM Signed  I suspect his blood pressure is running somewhat low. Adjust lisinopril as noted.  Hyperlipidemia - Tereso Newcomer, [place] 06/19/2011 4:55 PM Addendum  arrange lipids and LFTs in 6-8 weeks. Previous Version  Snoring - Tereso Newcomer, [place] 06/19/2011 4:55 PM Signed  Schedule split night sleep test  Signed, Tereso Newcomer, PA-C  2:40 PM 07/04/2011

## 2011-07-08 ENCOUNTER — Ambulatory Visit (INDEPENDENT_AMBULATORY_CARE_PROVIDER_SITE_OTHER): Payer: Federal, State, Local not specified - PPO | Admitting: Internal Medicine

## 2011-07-08 ENCOUNTER — Encounter: Payer: Self-pay | Admitting: Internal Medicine

## 2011-07-08 VITALS — BP 128/78 | HR 108 | Ht 70.0 in | Wt 226.0 lb

## 2011-07-08 DIAGNOSIS — I4891 Unspecified atrial fibrillation: Secondary | ICD-10-CM

## 2011-07-08 DIAGNOSIS — I10 Essential (primary) hypertension: Secondary | ICD-10-CM

## 2011-07-08 MED ORDER — FLECAINIDE ACETATE 100 MG PO TABS: 100.0000 mg | ORAL_TABLET | Freq: Two times a day (BID) | ORAL | Status: DC

## 2011-07-08 NOTE — Progress Notes (Signed)
HPI Charles Mills is referred today for evaluation of persistent atrial fibrillation. The patient was diagnosed with atrial flutter ablation approximately 6 weeks ago. At that time, 2-D echo demonstrated preserved left ventricular systolic function, and moderate left atrial enlargement. His left atrial dimension was 47 mm. The patient has been anticoagulated and underwent cardioversion several days ago. He maintained sinus rhythm for 2 days and then had return of atrial fibrillation. He notes a distinct difference when he is in rhythm compared to when he is out of rhythm. He feels palpitations fatigue and weakness. He denies syncope. He has had no chest pain but does note dyspnea with exertion when he is in atrial fibrillation. An initial strategy using rate control and medications has been minimally successful. He has been anticoagulated with Rivaroxaban. No Known Allergies   Current Outpatient Prescriptions  Medication Sig Dispense Refill  . Calcium Carbonate (CALCIUM 600 PO) Take 600 mg by mouth 2 (two) times daily.        . Cholecalciferol (VITAMIN D) 2000 UNITS CAPS Take 1 capsule by mouth 2 (two) times daily.        . Coenzyme Q10 (CO Q 10) 100 MG CAPS Take 100 mg by mouth every morning.        . cyanocobalamin 500 MCG tablet Take 500 mcg by mouth every morning.        Marland Kitchen lisinopril (PRINIVIL,ZESTRIL) 20 MG tablet Take 0.5 tablets (10 mg total) by mouth daily.  30 tablet  6  . metoprolol succinate (TOPROL-XL) 25 MG 24 hr tablet Take one by mouth every morning and two by mouth every evening      . nitroGLYCERIN (NITROSTAT) 0.4 MG SL tablet Place 1 tablet (0.4 mg total) under the tongue every 5 (five) minutes as needed for chest pain.  25 tablet  1  . Omega-3 Fatty Acids (FISH OIL) 1200 MG CAPS Take 1 capsule by mouth 2 (two) times daily.        Marland Kitchen OVER THE COUNTER MEDICATION Place 2 drops into both eyes every morning. Murine Eye Drops       . Probiotic Product (PROBIOTIC PO) Take 1 tablet by mouth  every morning.        Marland Kitchen QUERCETIN PO Take 500 mg by mouth every morning.        . Rivaroxaban (XARELTO) 20 MG TABS Take 20 mg by mouth daily with supper.  30 tablet  3  . rosuvastatin (CRESTOR) 40 MG tablet Take 1 tablet (40 mg total) by mouth daily.  30 tablet  3  . Saw Palmetto, Serenoa repens, (SAW PALMETTO BERRIES) 540 MG CAPS Take 1 capsule by mouth 2 (two) times daily.        . vitamin C (ASCORBIC ACID) 500 MG tablet Take 500 mg by mouth 2 (two) times daily.        . vitamin E 400 UNIT capsule Take 400 Units by mouth 2 (two) times daily.        . flecainide (TAMBOCOR) 100 MG tablet Take 1 tablet (100 mg total) by mouth 2 (two) times daily.  60 tablet  11     Past Medical History  Diagnosis Date  . HTN (hypertension)   . Atrial fibrillation     Echocardiogram 05/20/11: EF 55-65%, moderate LAE, mild RAE    ROS:   All systems reviewed and negative except as noted in the HPI.   Past Surgical History  Procedure Date  . Tonsillectomy   . Cardioversion 07/02/2011  Procedure: CARDIOVERSION;  Surgeon: Wendall Stade, MD;  Location: [facility] OR;  Service: Cardiovascular;  Laterality: N/A;     No family history on file.   History   Social History  . Marital Status: Divorced    Spouse Name: N/A    Number of Children: N/A  . Years of Education: N/A   Occupational History  . Not on file.   Social History Main Topics  . Smoking status: Never Smoker   . Smokeless tobacco: Not on file  . Alcohol Use: Yes     occassionally  . Drug Use: No  . Sexually Active:    Other Topics Concern  . Not on file   Social History Narrative  . No narrative on file     BP 128/78  Pulse 108  Ht 5\' 10"  (1.778 m)  Wt 102.513 kg (226 lb)  BMI 32.43 kg/m2  Physical Exam:  Well appearing NAD HEENT: Unremarkable Neck:  No JVD, no thyromegally Lymphatics:  No adenopathy Back:  No CVA tenderness Lungs:  Clear HEART:  IRegular rate rhythm, no murmurs, no rubs, no clicks Abd:  soft,  positive bowel sounds, no organomegally, no rebound, no guarding Ext:  2 plus pulses, no edema, no cyanosis, no clubbing Skin:  No rashes no nodules Neuro:  CN II through XII intact, motor grossly intact  EKG Atrial fibrillation with a ventricular rate of 108 beats per minute  Assess/Plan:

## 2011-07-08 NOTE — Assessment & Plan Note (Signed)
His blood pressure today is well controlled. He will maintain a low-sodium diet and continue his medications as previously noted.

## 2011-07-08 NOTE — Patient Instructions (Signed)
Your physician recommends that you schedule a follow-up appointment in: 1 week for an EKG and 2 weeks with Dr Ladona Ridgel  Your physician has recommended you make the following change in your medication:  1) Stop Diltiazem 2) Start Flecainide 100mg  twice daily

## 2011-07-08 NOTE — Assessment & Plan Note (Signed)
I have outlined the treatment options for Charles Mills in detail. I would recommend starting flecainide. He will continue his anticoagulation. He will discontinue his diltiazem. He will return in one week for an EKG. I will see him after that. He may require a repeat cardioversion. Hopefully, flecainide will return him to sinus rhythm. Today we briefly discussed catheter ablation. Additional consideration for this procedure we'll be based on the result of initiation of antiarrhythmic drug therapy.

## 2011-07-16 ENCOUNTER — Ambulatory Visit (INDEPENDENT_AMBULATORY_CARE_PROVIDER_SITE_OTHER): Payer: Federal, State, Local not specified - PPO

## 2011-07-16 DIAGNOSIS — I4891 Unspecified atrial fibrillation: Secondary | ICD-10-CM

## 2011-07-16 DIAGNOSIS — Z79899 Other long term (current) drug therapy: Secondary | ICD-10-CM

## 2011-07-16 NOTE — Progress Notes (Signed)
Patient in for An EKG. Cardizem LA 240 mg daily discontinue and started taken Flecainide 100 mg two times daily. EKG done per RN and read per Dr. Excell Seltzer DOD, A- fibrillation ventricular rate 66 beats/ minute. B/P left arm 96/72, right arm 108/78.Patient said  is feeling better after starting the Flecainide 100 mg. Dr. Excell Seltzer DOD recommended for pt to continue same medication regimen. Patient aware, he verbalized understanding.

## 2011-07-22 ENCOUNTER — Ambulatory Visit (INDEPENDENT_AMBULATORY_CARE_PROVIDER_SITE_OTHER): Payer: Federal, State, Local not specified - PPO | Admitting: Internal Medicine

## 2011-07-22 ENCOUNTER — Encounter: Payer: Self-pay | Admitting: *Deleted

## 2011-07-22 ENCOUNTER — Encounter (HOSPITAL_COMMUNITY): Payer: Self-pay | Admitting: Pharmacy Technician

## 2011-07-22 ENCOUNTER — Encounter: Payer: Self-pay | Admitting: Internal Medicine

## 2011-07-22 VITALS — BP 124/62 | HR 80 | Ht 70.0 in | Wt 227.0 lb

## 2011-07-22 DIAGNOSIS — I4891 Unspecified atrial fibrillation: Secondary | ICD-10-CM

## 2011-07-22 DIAGNOSIS — I10 Essential (primary) hypertension: Secondary | ICD-10-CM

## 2011-07-22 MED ORDER — FLECAINIDE ACETATE 100 MG PO TABS: ORAL_TABLET | ORAL | Status: DC

## 2011-07-22 NOTE — Assessment & Plan Note (Signed)
His blood pressure is well controlled. He will continue his current medical therapy and maintain a low-sodium diet. 

## 2011-07-22 NOTE — Assessment & Plan Note (Signed)
Today we discussed treatment options. One option which I recommended would be to increase his flecainide 150 mg twice daily. We would then proceed with DC cardioversion if he does not return to sinus rhythm on the higher dose of flecainide. A second option would be a trial of rate control. His ventricular rate is much improved. Finally, we discussed the possibility of catheter ablation. I would prefer to maintain him in sinus rhythm at least mostly before referring him for catheter ablation. If he decides that catheter ablation is what he would like to pursue, I would suggest amiodarone for several months in hopes of maintaining him in sinus rhythm prior to a catheter ablation procedure.

## 2011-07-22 NOTE — Patient Instructions (Signed)
Your physician has recommended that you have a Cardioversion (DCCV). Electrical Cardioversion uses a jolt of electricity to your heart either through paddles or wired patches attached to your chest. This is a controlled, usually prescheduled, procedure. Defibrillation is done under light anesthesia in the hospital, and you usually go home the day of the procedure. This is done to get your heart back into a normal rhythm. You are not awake for the procedure. Please see the instruction sheet given to you today.   Your physician has recommended you make the following change in your medication:  1) Increase Flecainide to 150mg  twice daily

## 2011-07-22 NOTE — Progress Notes (Signed)
Addended by: Dennis Bast F on: 07/22/2011 03:30 PM   Modules accepted: Orders

## 2011-07-22 NOTE — Progress Notes (Signed)
HPI [name] returns today for followup. He is a very pleasant 56 year old man with a history of atrial fibrillation. He was placed on flecainide 100 mg twice daily several weeks ago. In addition he has been on chronic anticoagulation. Since then, the patient feels better. His ventricular rate appears improved and his dyspnea is much improved. He has continued to have atrial fibrillation. He denies cough or hemoptysis. No peripheral edema. No chest pain. No Known Allergies   Current Outpatient Prescriptions  Medication Sig Dispense Refill  . Calcium Carbonate (CALCIUM 600 PO) Take 600 mg by mouth 2 (two) times daily.        . Cholecalciferol (VITAMIN D) 2000 UNITS CAPS Take 1 capsule by mouth 2 (two) times daily.        . Coenzyme Q10 (CO Q 10) 100 MG CAPS Take 100 mg by mouth every morning.        . cyanocobalamin 500 MCG tablet Take 500 mcg by mouth every morning.        . flecainide (TAMBOCOR) 100 MG tablet Take 1 tablet (100 mg total) by mouth 2 (two) times daily.  60 tablet  11  . lisinopril (PRINIVIL,ZESTRIL) 20 MG tablet Take 0.5 tablets (10 mg total) by mouth daily.  30 tablet  6  . metoprolol succinate (TOPROL-XL) 25 MG 24 hr tablet Take one by mouth every morning and two by mouth every evening      . nitroGLYCERIN (NITROSTAT) 0.4 MG SL tablet Place 1 tablet (0.4 mg total) under the tongue every 5 (five) minutes as needed for chest pain.  25 tablet  1  . Omega-3 Fatty Acids (FISH OIL) 1200 MG CAPS Take 1 capsule by mouth 2 (two) times daily.        Marland Kitchen OVER THE COUNTER MEDICATION Place 2 drops into both eyes every morning. Murine Eye Drops       . Probiotic Product (PROBIOTIC PO) Take 1 tablet by mouth every morning.        Marland Kitchen QUERCETIN PO Take 500 mg by mouth every morning.        . Rivaroxaban (XARELTO) 20 MG TABS Take 20 mg by mouth daily with supper.  30 tablet  3  . rosuvastatin (CRESTOR) 40 MG tablet Take 1 tablet (40 mg total) by mouth daily.  30 tablet  3  . Saw Palmetto, Serenoa  repens, (SAW PALMETTO BERRIES) 540 MG CAPS Take 1 capsule by mouth 2 (two) times daily.        . vitamin C (ASCORBIC ACID) 500 MG tablet Take 500 mg by mouth 2 (two) times daily.        . vitamin E 400 UNIT capsule Take 400 Units by mouth 2 (two) times daily.           Past Medical History  Diagnosis Date  . HTN (hypertension)   . Atrial fibrillation     Echocardiogram 05/20/11: EF 55-65%, moderate LAE, mild RAE    ROS:   All systems reviewed and negative except as noted in the HPI.   Past Surgical History  Procedure Date  . Tonsillectomy   . Cardioversion 07/02/2011    Procedure: CARDIOVERSION;  Surgeon: Wendall Stade, MD;  Location: [facility] OR;  Service: Cardiovascular;  Laterality: N/A;     No family history on file.   History   Social History  . Marital Status: Divorced    Spouse Name: N/A    Number of Children: N/A  . Years of Education: N/A  Occupational History  . Not on file.   Social History Main Topics  . Smoking status: Never Smoker   . Smokeless tobacco: Not on file  . Alcohol Use: Yes     occassionally  . Drug Use: No  . Sexually Active:    Other Topics Concern  . Not on file   Social History Narrative  . No narrative on file     BP 124/62  Pulse 80  Ht 5\' 10"  (1.778 m)  Wt 102.967 kg (227 lb)  BMI 32.57 kg/m2  Physical Exam:  Well appearing middle-aged man, NAD HEENT: Unremarkable Neck:  No JVD, no thyromegally Lungs:  Clear with no wheezes, rales, or rhonchi. HEART:  IRegular rate rhythm, no murmurs, no rubs, no clicks Abd:  soft, positive bowel sounds, no organomegally, no rebound, no guarding Ext:  2 plus pulses, no edema, no cyanosis, no clubbing Skin:  No rashes no nodules Neuro:  CN II through XII intact, motor grossly intact  EKG Atrial fibrillation with a controlled ventricular response. QRS duration 94 ms.   Assess/Plan:

## 2011-07-25 ENCOUNTER — Telehealth: Payer: Self-pay | Admitting: Internal Medicine

## 2011-07-25 ENCOUNTER — Telehealth: Payer: Self-pay | Admitting: *Deleted

## 2011-07-25 ENCOUNTER — Other Ambulatory Visit: Payer: Self-pay | Admitting: Physician Assistant

## 2011-07-25 DIAGNOSIS — G4733 Obstructive sleep apnea (adult) (pediatric): Secondary | ICD-10-CM | POA: Insufficient documentation

## 2011-07-25 DIAGNOSIS — G4730 Sleep apnea, unspecified: Secondary | ICD-10-CM

## 2011-07-25 MED ORDER — METOPROLOL SUCCINATE ER 25 MG PO TB24: ORAL_TABLET | ORAL | Status: DC

## 2011-07-25 NOTE — Telephone Encounter (Signed)
Sent to pharmacy.  Scott W. PA increased 06/19/11

## 2011-07-25 NOTE — Telephone Encounter (Signed)
Sleep medicine referral order placed today, pt aware [facility] will call and schedule appt, pt notified of sleep study results today and gave verbal understanding today. Danielle Rankin

## 2011-07-25 NOTE — Telephone Encounter (Signed)
New msg Pt said he is at [facility] now and he is out metoprolol and he says he takes one in morning and two in afternoon and they will not fill rx Please call him back on his call asap

## 2011-07-25 NOTE — Telephone Encounter (Signed)
Message copied by Tarri Fuller on Fri Jul 25, 2011  8:48 AM ------      Message from: [place], [place] T      Created: Fri Jul 25, 2011  8:25 AM      Regarding: FW: sleep study results       Sleep study shows mild to mod sleep apnea.      Not sure if patient has been notified.      Please notify and refer to sleep medicine for help with management      Tereso Newcomer, PA-C  8:25 AM 07/25/2011                    ----- Message -----         From: Sharyn Blitz, RN         Sent: 07/15/2011   5:04 PM           To: Beatrice Lecher, PA      Subject: RE: sleep study results                                  Look in encounters in pt's chart.  I think it was the 4th or 5th one down from the top.            ----- Message -----         From: Beatrice Lecher, PA         Sent: 07/15/2011   4:39 PM           To: Sharyn Blitz, RN      Subject: RE: sleep study results                                  I see the results but no interpretation.      Is there more than one place to find results?      Scott                  ----- Message -----         From: Sharyn Blitz, RN         Sent: 07/14/2011  10:20 AM           To: Beatrice Lecher, PA      Subject: sleep study results                                      You ordered a sleep study.  Just wanted to make sure that you saw results.  I am not sure if this went to your in basket.

## 2011-07-25 NOTE — Telephone Encounter (Signed)
Called Charles Mills and spoke with him regarding his sleep study results.  Informed that Tereso Newcomer, PA would like to refer him to sleep medicine.  Charles Mills okay with this and referral was sent in today.  Informed patient that he would be contacted to set appointment from sleep medicine clinic. Judithe Modest, CMA

## 2011-07-29 NOTE — Progress Notes (Signed)
Addended by: Judithe Modest D on: 07/29/2011 11:02 AM   Modules accepted: Orders

## 2011-07-30 ENCOUNTER — Ambulatory Visit (INDEPENDENT_AMBULATORY_CARE_PROVIDER_SITE_OTHER): Payer: Federal, State, Local not specified - PPO | Admitting: *Deleted

## 2011-07-30 DIAGNOSIS — I4891 Unspecified atrial fibrillation: Secondary | ICD-10-CM

## 2011-07-30 DIAGNOSIS — I10 Essential (primary) hypertension: Secondary | ICD-10-CM

## 2011-07-30 LAB — CBC WITH DIFFERENTIAL/PLATELET
Basophils Absolute: 0 10*3/uL (ref 0.0–0.1)
Hemoglobin: 13.8 g/dL (ref 13.0–17.0)
Lymphocytes Relative: 42.8 % (ref 12.0–46.0)
Monocytes Relative: 10.9 % (ref 3.0–12.0)
Neutro Abs: 2.8 10*3/uL (ref 1.4–7.7)
Neutrophils Relative %: 44.4 % (ref 43.0–77.0)
Platelets: 227 10*3/uL (ref 150.0–400.0)
RDW: 13.5 % (ref 11.5–14.6)

## 2011-07-30 LAB — BASIC METABOLIC PANEL
CO2: 27 mEq/L (ref 19–32)
Chloride: 105 mEq/L (ref 96–112)
Potassium: 4.7 mEq/L (ref 3.5–5.1)
Sodium: 138 mEq/L (ref 135–145)

## 2011-08-05 ENCOUNTER — Other Ambulatory Visit: Payer: Self-pay | Admitting: *Deleted

## 2011-08-05 DIAGNOSIS — I4891 Unspecified atrial fibrillation: Secondary | ICD-10-CM

## 2011-08-05 MED ORDER — SODIUM CHLORIDE 0.9 % IV SOLN: INTRAVENOUS | Status: DC

## 2011-08-06 ENCOUNTER — Encounter (HOSPITAL_COMMUNITY)
Admission: RE | Disposition: A | Discharge status: Home / Self Care | Payer: Self-pay | Source: Ambulatory Visit | Attending: Internal Medicine

## 2011-08-06 ENCOUNTER — Other Ambulatory Visit: Payer: Self-pay

## 2011-08-06 ENCOUNTER — Ambulatory Visit (HOSPITAL_COMMUNITY)
Admission: RE | Admit: 2011-08-06 | Discharge: 2011-08-06 | Disposition: A | Discharge status: Home / Self Care | Payer: Federal, State, Local not specified - PPO | Source: Ambulatory Visit | Attending: Internal Medicine | Admitting: Internal Medicine

## 2011-08-06 ENCOUNTER — Encounter (HOSPITAL_COMMUNITY): Payer: Self-pay | Admitting: Anesthesiology

## 2011-08-06 ENCOUNTER — Ambulatory Visit (HOSPITAL_COMMUNITY): Payer: Federal, State, Local not specified - PPO | Admitting: Anesthesiology

## 2011-08-06 DIAGNOSIS — I4891 Unspecified atrial fibrillation: Secondary | ICD-10-CM

## 2011-08-06 DIAGNOSIS — I10 Essential (primary) hypertension: Secondary | ICD-10-CM

## 2011-08-06 HISTORY — PX: CARDIOVERSION: SHX1299

## 2011-08-06 SURGERY — CARDIOVERSION
Anesthesia: General | Wound class: Clean

## 2011-08-06 MED ORDER — SODIUM CHLORIDE 0.9 % IJ SOLN: 3.0000 mL | Freq: Two times a day (BID) | INTRAMUSCULAR | Status: DC

## 2011-08-06 MED ORDER — SODIUM CHLORIDE 0.9 % IV SOLN: 250.0000 mL | INTRAVENOUS | Status: DC

## 2011-08-06 MED ORDER — SODIUM CHLORIDE 0.9 % IV SOLN
INTRAVENOUS | Status: DC | PRN
  Administered 2011-08-06: 13:00:00 via INTRAVENOUS

## 2011-08-06 MED ORDER — HYDROCORTISONE 1 % EX CREA
1.0000 "application " | TOPICAL_CREAM | Freq: Three times a day (TID) | CUTANEOUS | Status: DC | PRN
  Filled 2011-08-06: qty 28

## 2011-08-06 MED ORDER — SODIUM CHLORIDE 0.9 % IJ SOLN: 3.0000 mL | INTRAMUSCULAR | Status: DC | PRN

## 2011-08-06 MED ORDER — PROPOFOL 10 MG/ML IV EMUL
INTRAVENOUS | Status: DC | PRN
  Administered 2011-08-06: 200 mg via INTRAVENOUS

## 2011-08-06 NOTE — Discharge Instructions (Signed)
Electrical Cardioversion Cardioversion is the delivery of a jolt of electricity to change the rhythm of the heart. Sticky patches or metal paddles are placed on the chest to deliver the electricity from a special device. This is done to restore a normal rhythm. A rhythm that is too fast or not regular keeps the heart from pumping well. Compared to medicines used to change an abnormal rhythm, cardioversion is faster and works better. It is also unpleasant and may dislodge blood clots from the heart. WHEN WOULD THIS BE DONE?  In an emergency:   There is low or no blood pressure as a result of the heart rhythm.   Normal rhythm must be restored as fast as possible to protect the brain and heart from further damage.   It may save a life.   For less serious heart rhythms, such as atrial fibrillation or flutter, in which:   The heart is beating too fast or is not regular.   The heart is still able to pump enough blood, but not as well as it should.   Medicine to change the rhythm has not worked.   It is safe to wait in order to allow time for preparation.  LET YOUR CAREGIVER KNOW ABOUT:   Every medicine you are taking. It is very important to do this! Know when to take or stop taking any of them.   Any time in the past that you have felt your heart was not beating normally.  RISKS AND COMPLICATIONS   Clots may form in the chambers of the heart if it is beating too fast. These clots may be dislodged during the procedure and travel to other parts of the body.   There is risk of a stroke during and after the procedure if a clot moves. Blood thinners lower this risk.   You may have a special test of your heart (TEE) to make sure there are no clots in your heart.  BEFORE THE PROCEDURE   You may have some tests to see how well your heart is working.   You may start taking blood thinners so your blood does not clot as easily.   Other drugs may be given to help your heart work better.    PROCEDURE (SCHEDULED)  The procedure is typically done in a hospital by a heart doctor (cardiologist).   You will be told when and where to go.   You may be given some medicine through an intravenous (IV) access to reduce discomfort and make you sleepy before the procedure.   Your whole body may move when the shock is delivered. Your chest may feel sore.   You may be able to go home after a few hours. Your heart rhythm will be watched to make sure it does not change.  HOME CARE INSTRUCTIONS   Only take medicine as directed by your caregiver. Be sure you understand how and when to take your medicine.   Learn how to feel your pulse and check it often.   Limit your activity for 48 hours.   Avoid caffeine and other stimulants as directed.  SEEK MEDICAL CARE IF:   You feel like your heart is beating too fast or your pulse is not regular.   You have any questions about your medicines.   You have bleeding that will not stop.  SEEK IMMEDIATE MEDICAL CARE IF:   You are dizzy or feel faint.   It is hard to breathe or you feel short of breath.     There is a change in discomfort in your chest.   Your speech is slurred or you have trouble moving your arm or leg on one side.   You get a muscle cramp.   Your fingers or toes turn cold or blue.  MAKE SURE YOU:   Understand these instructions.   Will watch your condition.   Will get help right away if you are not doing well or get worse.  Document Released: 05/23/2002 Document Revised: 02/12/2011 Document Reviewed: 09/22/2007 [employer] Patient Information 2012 [employer]. 

## 2011-08-06 NOTE — Interval H&P Note (Signed)
History and Physical Interval Note:  08/06/2011 12:33 PM  Charles Mills  has presented today for surgery, with the diagnosis of AFIB  The various methods of treatment have been discussed with the patient and family. After consideration of risks, benefits and other options for treatment, the patient has consented to  Procedure(s) (LRB): CARDIOVERSION (N/A) as a surgical intervention .  The patients' history has been reviewed, patient examined, no change in status, stable for surgery.  I have reviewed the patients' chart and labs.  Questions were answered to the patient's satisfaction.     [name]

## 2011-08-06 NOTE — H&P (View-Only) (Signed)
HPI Charles Mills returns today for followup. He is a very pleasant 55-year-old man with a history of atrial fibrillation. He was placed on flecainide 100 mg twice daily several weeks ago. In addition he has been on chronic anticoagulation. Since then, the patient feels better. His ventricular rate appears improved and his dyspnea is much improved. He has continued to have atrial fibrillation. He denies cough or hemoptysis. No peripheral edema. No chest pain. No Known Allergies   Current Outpatient Prescriptions  Medication Sig Dispense Refill  . Calcium Carbonate (CALCIUM 600 PO) Take 600 mg by mouth 2 (two) times daily.        . Cholecalciferol (VITAMIN D) 2000 UNITS CAPS Take 1 capsule by mouth 2 (two) times daily.        . Coenzyme Q10 (CO Q 10) 100 MG CAPS Take 100 mg by mouth every morning.        . cyanocobalamin 500 MCG tablet Take 500 mcg by mouth every morning.        . flecainide (TAMBOCOR) 100 MG tablet Take 1 tablet (100 mg total) by mouth 2 (two) times daily.  60 tablet  11  . lisinopril (PRINIVIL,ZESTRIL) 20 MG tablet Take 0.5 tablets (10 mg total) by mouth daily.  30 tablet  6  . metoprolol succinate (TOPROL-XL) 25 MG 24 hr tablet Take one by mouth every morning and two by mouth every evening      . nitroGLYCERIN (NITROSTAT) 0.4 MG SL tablet Place 1 tablet (0.4 mg total) under the tongue every 5 (five) minutes as needed for chest pain.  25 tablet  1  . Omega-3 Fatty Acids (FISH OIL) 1200 MG CAPS Take 1 capsule by mouth 2 (two) times daily.        . OVER THE COUNTER MEDICATION Place 2 drops into both eyes every morning. Murine Eye Drops       . Probiotic Product (PROBIOTIC PO) Take 1 tablet by mouth every morning.        . QUERCETIN PO Take 500 mg by mouth every morning.        . Rivaroxaban (XARELTO) 20 MG TABS Take 20 mg by mouth daily with supper.  30 tablet  3  . rosuvastatin (CRESTOR) 40 MG tablet Take 1 tablet (40 mg total) by mouth daily.  30 tablet  3  . Saw Palmetto, Serenoa  repens, (SAW PALMETTO BERRIES) 540 MG CAPS Take 1 capsule by mouth 2 (two) times daily.        . vitamin C (ASCORBIC ACID) 500 MG tablet Take 500 mg by mouth 2 (two) times daily.        . vitamin E 400 UNIT capsule Take 400 Units by mouth 2 (two) times daily.           Past Medical History  Diagnosis Date  . HTN (hypertension)   . Atrial fibrillation     Echocardiogram 05/20/11: EF 55-65%, moderate LAE, mild RAE    ROS:   All systems reviewed and negative except as noted in the HPI.   Past Surgical History  Procedure Date  . Tonsillectomy   . Cardioversion 07/02/2011    Procedure: CARDIOVERSION;  Surgeon: Peter C Nishan, MD;  Location: [facility] OR;  Service: Cardiovascular;  Laterality: N/A;     No family history on file.   History   Social History  . Marital Status: Divorced    Spouse Name: N/A    Number of Children: N/A  . Years of Education: N/A     Occupational History  . Not on file.   Social History Main Topics  . Smoking status: Never Smoker   . Smokeless tobacco: Not on file  . Alcohol Use: Yes     occassionally  . Drug Use: No  . Sexually Active:    Other Topics Concern  . Not on file   Social History Narrative  . No narrative on file     BP 124/62  Pulse 80  Ht 5' 10" (1.778 m)  Wt 102.967 kg (227 lb)  BMI 32.57 kg/m2  Physical Exam:  Well appearing middle-aged man, NAD HEENT: Unremarkable Neck:  No JVD, no thyromegally Lungs:  Clear with no wheezes, rales, or rhonchi. HEART:  IRegular rate rhythm, no murmurs, no rubs, no clicks Abd:  soft, positive bowel sounds, no organomegally, no rebound, no guarding Ext:  2 plus pulses, no edema, no cyanosis, no clubbing Skin:  No rashes no nodules Neuro:  CN II through XII intact, motor grossly intact  EKG Atrial fibrillation with a controlled ventricular response. QRS duration 94 ms.   Assess/Plan:  

## 2011-08-06 NOTE — Transfer of Care (Signed)
Immediate Anesthesia Transfer of Care Note  Patient: Charles Mills  Procedure(s) Performed: Procedure(s) (LRB): CARDIOVERSION (N/A)  Patient Location: PACU and Short Stay  Anesthesia Type: MAC  Level of Consciousness: awake, alert , oriented and sedated  Airway & Oxygen Therapy: Patient Spontanous Breathing and Patient connected to nasal cannula oxygen  Post-op Assessment: Report given to PACU RN  Post vital signs: Reviewed and stable  Complications: No apparent anesthesia complications

## 2011-08-06 NOTE — Preoperative (Signed)
Beta Blockers   Reason not to administer Beta Blockers:Not Applicable 

## 2011-08-06 NOTE — Anesthesia Preprocedure Evaluation (Signed)
Anesthesia Evaluation  Patient identified by MRN, date of birth, ID band Patient awake    Reviewed: Allergy & Precautions, H&P , NPO status , Patient's Chart, lab work & pertinent test results  Airway Mallampati: I TM Distance: >3 FB Neck ROM: Full    Dental  (+) Teeth Intact and Dental Advisory Given   Pulmonary  clear to auscultation        Cardiovascular Regular Normal    Neuro/Psych    GI/Hepatic   Endo/Other    Renal/GU      Musculoskeletal   Abdominal   Peds  Hematology   Anesthesia Other Findings   Reproductive/Obstetrics                           Anesthesia Physical Anesthesia Plan  ASA: II  Anesthesia Plan: General   Post-op Pain Management:    Induction: Intravenous  Airway Management Planned: Mask  Additional Equipment:   Intra-op Plan:   Post-operative Plan:   Informed Consent: I have reviewed the patients History and Physical, chart, labs and discussed the procedure including the risks, benefits and alternatives for the proposed anesthesia with the patient or authorized representative who has indicated his/her understanding and acceptance.   Dental advisory given  Plan Discussed with: CRNA, Anesthesiologist and Surgeon  Anesthesia Plan Comments:         Anesthesia Quick Evaluation

## 2011-08-06 NOTE — Anesthesia Postprocedure Evaluation (Signed)
  Anesthesia Post-op Note  Patient: Charles Mills  Procedure(s) Performed: Procedure(s) (LRB): CARDIOVERSION (N/A)  Patient Location: PACU and Short Stay  Anesthesia Type: General  Level of Consciousness: awake, alert  and oriented  Airway and Oxygen Therapy: Patient Spontanous Breathing  Post-op Pain: none  Post-op Assessment: Post-op Vital signs reviewed, Patient's Cardiovascular Status Stable, Respiratory Function Stable, Patent Airway, No signs of Nausea or vomiting and Pain level controlled  Post-op Vital Signs: Reviewed and stable  Complications: No apparent anesthesia complications

## 2011-08-06 NOTE — Op Note (Signed)
DCCV performed without immediate complication. [id number]

## 2011-08-07 NOTE — Op Note (Signed)
NAMETAISON, CELANI NO.:  [id number]  MEDICAL RECORD NO.:  [id number]  LOCATION:  [facility]                         FACILITY:  [facility]  PHYSICIAN:  Doylene Canning. Ladona Ridgel, MD    DATE OF BIRTH:  1956/05/18  DATE OF PROCEDURE:  08/06/2011 DATE OF DISCHARGE:  08/06/2011                              OPERATIVE REPORT   PROCEDURE PERFORMED:  DC cardioversion.  INDICATION:  Symptomatic atrial fibrillation.  INTRODUCTION:  The patient is 56 year old male with history of symptomatic atrial fibrillation.  He is now 150 mg twice a day of flecainide and is referred for DC cardioversion.  PROCEDURE:  After informed was obtained, the patient was sedated under the direction of Dr. Sondra Come with IV propofol.  A 200 joules of synchronized biphasic energy was subsequently delivered in the anterior- posterior position through the electrode dispersive pad which resulted in termination of atrial fibrillation and restoration of sinus rhythm. The patient tolerated procedure well and was allowed to awaken in the usual fashion.  COMPLICATIONS:  There was no immediate procedure complications.  RESULTS:  This demonstrate successful DC cardioversion in a patient with symptomatic AFib.     Doylene Canning. Ladona Ridgel, MD     GWT/MEDQ  D:  08/06/2011  T:  08/07/2011  Job:  [id number]

## 2011-08-13 ENCOUNTER — Telehealth: Payer: Self-pay | Admitting: Internal Medicine

## 2011-08-13 MED ORDER — FLECAINIDE ACETATE 100 MG PO TABS: 150.0000 mg | ORAL_TABLET | Freq: Two times a day (BID) | ORAL | Status: DC

## 2011-08-13 NOTE — Telephone Encounter (Signed)
Spoke with patient  His HR is back down now and he is going to keep a journal of events.  I will have his Scheduled for a EPH visit for s/p DCCV.

## 2011-08-13 NOTE — Telephone Encounter (Signed)
New Msg: Pt calling wanting to speak with nurse/MD regarding some episodes of tachycardia pt had for about 24 hours; Monday night at 9pm until last night (Tuesday) around 9pm. Pt would like to discuss this with MD and be advised as to how to proceed. Please return pt call to discuss further.

## 2011-08-14 ENCOUNTER — Encounter (HOSPITAL_COMMUNITY): Payer: Self-pay | Admitting: Internal Medicine

## 2011-08-15 ENCOUNTER — Encounter: Payer: Self-pay | Admitting: Pulmonary Disease

## 2011-08-15 ENCOUNTER — Ambulatory Visit (INDEPENDENT_AMBULATORY_CARE_PROVIDER_SITE_OTHER): Payer: Federal, State, Local not specified - PPO | Admitting: Pulmonary Disease

## 2011-08-15 VITALS — BP 130/82 | HR 48 | Temp 98.3°F | Ht 70.0 in | Wt 229.6 lb

## 2011-08-15 DIAGNOSIS — G4733 Obstructive sleep apnea (adult) (pediatric): Secondary | ICD-10-CM

## 2011-08-15 NOTE — Progress Notes (Signed)
  Subjective:    Patient ID: Charles Mills, male    DOB: [birth year], 56 y.o.   MRN: [id number]  HPI The patient is a 56 year old male who I've been asked to see for management of obstructive sleep apnea.  He recently underwent MPS G., where he was found to have mild OSA.  Patient has also had issues with atrial fibrillation, and is status post cardioversion recently.  The patient has been noted to have snoring in the past, but is much improved since 825 pound weight loss and also changing his sleeping position.  His bed partner states he rarely snores at this time.  He has never been noted to have an abnormal breathing pattern during sleep the patient feels very rested in the mornings upon arising since his weight loss and sleeping position change, but also since retiring.  The patient states that he is very satisfied with his sleep currently.  He denies any alert his issues during the day, nor does he have a problem watching movies or reading in the evenings.  He denies any issues with driving.  The patient has lost 25 pounds since December, and his Epworth score today is only 2.  Sleep Questionnaire: What time do you typically go to bed?( Between what hours) 11:00 to 11:30 pm How long does it take you to fall asleep? just a few mins How many times during the night do you wake up? 2 What time do you get out of bed to start your day? 0730 Do you drive or operate heavy machinery in your occupation? No How much has your weight changed (up or down) over the past two years? (In pounds) 25 lb (11.34 kg) Have you ever had a sleep study before? Yes If yes, location of study? [facility] If yes, date of study? 06/2011 Do you currently use CPAP? No Do you wear oxygen at any time? No    Review of Systems  Constitutional: Negative for fever and unexpected weight change.  HENT: Negative for ear pain, nosebleeds, congestion, sore throat, rhinorrhea, sneezing, trouble swallowing, dental problem, postnasal drip and sinus  pressure.   Eyes: Negative for redness and itching.  Respiratory: Negative for cough, chest tightness, shortness of breath and wheezing.   Cardiovascular: Negative for palpitations and leg swelling.  Gastrointestinal: Negative for nausea and vomiting.  Genitourinary: Negative for dysuria.  Musculoskeletal: Negative for joint swelling.  Skin: Negative for rash.  Neurological: Negative for headaches.  Hematological: Does not bruise/bleed easily.  Psychiatric/Behavioral: Negative for dysphoric mood. The patient is not nervous/anxious.        Objective:   Physical Exam Constitutional:  Well developed, no acute distress  HENT:  Nares patent without discharge  Oropharynx without exudate, palate and uvula are mildly elongated.  Eyes:  Perrla, eomi, no scleral icterus  Neck:  No JVD, no TMG  Cardiovascular:  Normal rate, regular rhythm, no rubs or gallops.  No murmurs        Intact distal pulses  Pulmonary :  Normal breath sounds, no stridor or respiratory distress   No rales, rhonchi, or wheezing  Abdominal:  Soft, nondistended, bowel sounds present.  No tenderness noted.   Musculoskeletal:  No lower extremity edema noted.  Lymph Nodes:  No cervical lymphadenopathy noted  Skin:  No cyanosis noted  Neurologic:  Alert, appropriate, moves all 4 extremities without obvious deficit.         Assessment & Plan:

## 2011-08-15 NOTE — Patient Instructions (Signed)
Continue working on weight loss, favorable sleeping position If your symptoms worsen, or if the control of your afib becomes more of an issue, will consider more aggressive treatment of you sleep apnea.

## 2011-08-15 NOTE — Assessment & Plan Note (Signed)
The patient has mild obstructive sleep apnea from his recent sleep study, however has seen near elimination of his snoring and much improved sleep since losing 25 pounds and changing his head position during sleep.  He feels that he sleeps well, and is completely satisfied with his daytime alertness.  He intends to continue with his weight loss.  I have reviewed the various ways to treat mild sleep apnea, including weight loss, surgery, dental appliance, and CPAP.  From a quality of life standpoint, I think the patient would do quite well with conservative treatment consisting of weight loss alone and positional therapy.  The big question here is whether his sleep disordered breathing is contributing to his atrial fibrillation.  His heart rate is currently under good control, and the patient would like to continue conservative treatment of his sleep apnea.  If he has worsening of his symptoms or if his heart rate becomes more of an issue, he is willing to try more aggressive therapy.

## 2011-08-22 ENCOUNTER — Ambulatory Visit (INDEPENDENT_AMBULATORY_CARE_PROVIDER_SITE_OTHER): Payer: Federal, State, Local not specified - PPO | Admitting: Physician Assistant

## 2011-08-22 ENCOUNTER — Encounter: Payer: Self-pay | Admitting: Physician Assistant

## 2011-08-22 VITALS — BP 118/82 | HR 90 | Ht 70.0 in | Wt 226.0 lb

## 2011-08-22 DIAGNOSIS — I4891 Unspecified atrial fibrillation: Secondary | ICD-10-CM

## 2011-08-22 NOTE — Progress Notes (Signed)
[address] [place], [place]  [address] Phone: [phone] Fax:  [phone]  Date:  08/22/2011   Name:  Charles Mills       DOB:  1955/10/07 MRN:  [id number]  PCP:  Karmen Stabs, PA-C Primary Electrophysiologist:  Dr. [name]    History of Present Illness: Charles Mills is a 56 y.o. male who presents for follow up s/p DCCV.    History:  Admitted 12/3-12/5 with AFib with RVR of unknown duration.  He was asymptomatic with his atrial fibrillation.  Echocardiogram 05/20/11: EF 55-65%, moderate LAE, mild RAE.  CHADS2 score is 1.  Anticoagulated with Xarelto.  Failed DCCV and was placed on Flecainide by Dr. [name] in 06/2011.  Flecainide increased to 150 mg BID 07/22/11 and he underwent DCCV 07/2011.    Sleep study notable for mild sleep apnea.  Seen by Dr. Shelle Iron 08/15/11 and conservative management with continued weight loss and positional therapy recommended.  More aggressive therapy could be tried if needed.  He can tell he has been out of rhythm for about a week now.  He feels fine.  The patient denies chest pain, shortness of breath, syncope, orthopnea, PND or significant pedal edema.  He monitors his pulse and feels that it is irregular.  He denies palpitations.   Past Medical History  Diagnosis Date  . HTN (hypertension)   . Atrial fibrillation     Echocardiogram 05/20/11: EF 55-65%, moderate LAE, mild RAE;  failed DCCV 06/2011;  Flecainided started 07/2011;  DCCV 08/06/11  . Mild obstructive sleep apnea     saw Dr. Shelle Iron 3/13:  conservative mgmt for now  . HLD (hyperlipidemia)     Current Outpatient Prescriptions  Medication Sig Dispense Refill  . Calcium Carbonate (CALCIUM 600 PO) Take 600 mg by mouth 2 (two) times daily.        . Cholecalciferol (VITAMIN D) 2000 UNITS CAPS Take 1 capsule by mouth 2 (two) times daily.        . Coenzyme Q10 (CO Q 10) 100 MG CAPS Take 100 mg by mouth every morning.        . cyanocobalamin 500 MCG tablet Take 500  mcg by mouth every morning.        Marland Kitchen lisinopril (PRINIVIL,ZESTRIL) 20 MG tablet Take 0.5 tablets (10 mg total) by mouth daily.  30 tablet  6  . metoprolol succinate (TOPROL-XL) 25 MG 24 hr tablet Take one by mouth every morning and two by mouth every evening  90 tablet  11  . nitroGLYCERIN (NITROSTAT) 0.4 MG SL tablet Place 0.4 mg under the tongue every 5 (five) minutes as needed. For chest pain      . Omega-3 Fatty Acids (FISH OIL) 1200 MG CAPS Take 1 capsule by mouth 2 (two) times daily.        Marland Kitchen OVER THE COUNTER MEDICATION Place 2 drops into both eyes every morning. Murine Eye Drops      . Probiotic Product (PROBIOTIC PO) Take 1 tablet by mouth every morning.        Marland Kitchen QUERCETIN PO Take 500 mg by mouth every morning.        . Rivaroxaban (XARELTO) 20 MG TABS Take 20 mg by mouth daily with supper.  30 tablet  3  . rosuvastatin (CRESTOR) 40 MG tablet Take 1 tablet (40 mg total) by mouth daily.  30 tablet  3  . Saw Palmetto, Serenoa repens, (SAW PALMETTO BERRIES) 540 MG CAPS Take 1  capsule by mouth 2 (two) times daily.        . vitamin C (ASCORBIC ACID) 500 MG tablet Take 500 mg by mouth 2 (two) times daily.        . vitamin E 400 UNIT capsule Take 400 Units by mouth 2 (two) times daily.          Allergies: No Known Allergies  History  Substance Use Topics  . Smoking status: Never Smoker   . Smokeless tobacco: Not on file  . Alcohol Use: Yes     occassionally     PHYSICAL EXAM: VS:  BP 118/82  Pulse 90  Ht 5\' 10"  (1.778 m)  Wt 226 lb (102.513 kg)  BMI 32.43 kg/m2 Well nourished, well developed, in no acute distress HEENT: normal Neck: no JVD Cardiac:  normal S1, S2; Irregularly irregular; no murmur Lungs:  clear to auscultation bilaterally, no wheezing, rhonchi or rales Abd: soft, nontender, no hepatomegaly Ext: no edema Skin: warm and dry Neuro:  CNs 2-12 intact, no focal abnormalities noted  EKG:   Coarse atrial fibrillation, heart rate 90  ASSESSMENT AND PLAN:   1.  Atrial fibrillation  We had a long discussion regarding further options for his atrial fibrillation as had been recommended by Dr. Ladona Ridgel.  Currently he is rate controlled.  He remains on anticoagulation.  I will stop his flecainide as he did not maintain sinus rhythm.  I will have him followup with Dr. Ladona Ridgel in the next 2-4 weeks to discuss further options and regarding rate control versus trying to maintain sinus rhythm with amiodarone in proceeding with ablation therapy.  Patient would like to consider these options.  He is comfortable with pursuing rate control for now.  He will remain on Xarelto.     Luna Glasgow, PA-C  9:54 AM 08/22/2011

## 2011-08-22 NOTE — Patient Instructions (Signed)
Your physician recommends that you schedule a follow-up appointment in: 2-4 WEEKS DR. Ladona Ridgel PER SCOTT WEAVER, PA-C, PT IS POST CARDIOVERSION AND IS BACK OUT OF RHYTHM

## 2011-09-12 ENCOUNTER — Other Ambulatory Visit: Payer: Self-pay | Admitting: Physician Assistant

## 2011-09-23 ENCOUNTER — Encounter: Payer: Self-pay | Admitting: Internal Medicine

## 2011-09-23 ENCOUNTER — Ambulatory Visit (INDEPENDENT_AMBULATORY_CARE_PROVIDER_SITE_OTHER): Payer: Federal, State, Local not specified - PPO | Admitting: Internal Medicine

## 2011-09-23 VITALS — BP 120/88 | HR 89 | Ht 70.0 in | Wt 225.0 lb

## 2011-09-23 DIAGNOSIS — I4891 Unspecified atrial fibrillation: Secondary | ICD-10-CM

## 2011-09-23 DIAGNOSIS — I10 Essential (primary) hypertension: Secondary | ICD-10-CM

## 2011-09-23 NOTE — Assessment & Plan Note (Signed)
His blood pressure remains well controlled. He will continue his current medical therapy and maintain a low-sodium diet. 

## 2011-09-23 NOTE — Progress Notes (Signed)
HPI [name] returns today for follow up. He is a very pleasant 56 year old man with persistent atrial fibrillation. He was initially treated with flecainide and underwent cardioversion. Unfortunately, he had return of atrial fibrillation. Since his cardioversion, he has felt well despite going back into atrial fibrillation. He thinks that stopping the flecainide helped improve his symptoms. He remains on metoprolol. He denies chest pain or syncope. He checks his heart rate and blood pressure several times a day and notes that it is almost never above 90 beats per minute. No Known Allergies   Current Outpatient Prescriptions  Medication Sig Dispense Refill  . Calcium Carbonate (CALCIUM 600 PO) Take 600 mg by mouth 2 (two) times daily.        . Cholecalciferol (VITAMIN D) 2000 UNITS CAPS Take 1 capsule by mouth 2 (two) times daily.        . Coenzyme Q10 (CO Q 10) 100 MG CAPS Take 100 mg by mouth every morning.        Marland Kitchen CRESTOR 40 MG tablet TAKE ONE TABLET BY MOUTH DAILY  30 tablet  2  . cyanocobalamin 500 MCG tablet Take 500 mcg by mouth every morning.        Marland Kitchen lisinopril (PRINIVIL,ZESTRIL) 20 MG tablet Take 0.5 tablets (10 mg total) by mouth daily.  30 tablet  6  . metoprolol succinate (TOPROL-XL) 25 MG 24 hr tablet Take one by mouth every morning and two by mouth every evening  90 tablet  11  . nitroGLYCERIN (NITROSTAT) 0.4 MG SL tablet Place 0.4 mg under the tongue every 5 (five) minutes as needed. For chest pain      . Omega-3 Fatty Acids (FISH OIL) 1200 MG CAPS Take 1 capsule by mouth 2 (two) times daily.       Marland Kitchen OVER THE COUNTER MEDICATION Place 2 drops into both eyes every morning. Murine Eye Drops      . Probiotic Product (PROBIOTIC PO) Take 1 tablet by mouth every morning.        Marland Kitchen QUERCETIN PO Take 500 mg by mouth every morning.        . Saw Palmetto, Serenoa repens, (SAW PALMETTO BERRIES) 540 MG CAPS Take 1 capsule by mouth 2 (two) times daily.        . vitamin C (ASCORBIC ACID) 500 MG  tablet Take 500 mg by mouth 2 (two) times daily.        . vitamin E 400 UNIT capsule Take 400 Units by mouth 2 (two) times daily.           Past Medical History  Diagnosis Date  . HTN (hypertension)   . Atrial fibrillation     Echocardiogram 05/20/11: EF 55-65%, moderate LAE, mild RAE;  failed DCCV 06/2011;  Flecainided started 07/2011;  DCCV 08/06/11  . Mild obstructive sleep apnea     saw Dr. Shelle Iron 3/13:  conservative mgmt for now  . HLD (hyperlipidemia)     ROS:   All systems reviewed and negative except as noted in the HPI.   Past Surgical History  Procedure Date  . Tonsillectomy   . Cardioversion 07/02/2011    Procedure: CARDIOVERSION;  Surgeon: Wendall Stade, MD;  Location: [facility] OR;  Service: Cardiovascular;  Laterality: N/A;  . Cardioversion 08/06/2011    Procedure: CARDIOVERSION;  Surgeon: [name], MD;  Location: [facility] OR;  Service: Cardiovascular;  Laterality: N/A;     No family history on file.   History   Social History  .  Marital Status: Divorced    Spouse Name: N/A    Number of Children: N/A  . Years of Education: N/A   Occupational History  . 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 Smoking status: Never Smoker   . Smokeless tobacco: Not on file  . Alcohol Use: Yes     occassionally  . Drug Use: No  . Sexually Active:    Other Topics Concern  . Not on file   Social History Narrative   Divorced. Now single. Lives with [facility].      BP 120/88  Pulse 89  Ht 5\' 10"  (1.778 m)  Wt 102.059 kg (225 lb)  BMI 32.28 kg/m2  Physical Exam:  Well appearing middle-aged man, NAD HEENT: Unremarkable Neck:  No JVD, no thyromegally Lungs:  Clear with no wheezes, rales, or rhonchi. HEART:  Regular rate rhythm, no murmurs, no rubs, no clicks Abd:  soft, positive bowel sounds, no organomegally, no rebound, no guarding Ext:  2 plus pulses, no edema, no cyanosis, no clubbing Skin:  No rashes no  nodules Neuro:  CN II through XII intact, motor grossly intact  Assess/Plan:

## 2011-09-23 NOTE — Patient Instructions (Signed)
Your physician wants you to follow-up in: 12 months with Dr Court Joy will receive a reminder letter in the mail two months in advance. If you don't receive a letter, please call our office to schedule the follow-up appointment.  Your physician has recommended you make the following change in your medication:  1) Stop Xarelto 2) Start Aspirin 81mg  daily

## 2011-09-23 NOTE — Assessment & Plan Note (Addendum)
Today we discussed the treatment options in detail. I again reviewed rate control versus rhythm control. At this point amiodarone would be the only medication that might result in him maintaining sinus rhythm. Catheter ablation would certainly be recommended if we went this route. Because the patient has felt better despite being in atrial fibrillation with the strategy of rate control, I have recommended rather than try to get back to sinus rhythm, that we continue rate control. I will see him back in several months. Finally, we discussed his risk for thromboembolic complications. He is low risk. I have recommended starting low-dose aspirin and discontinuing his Xarelto.

## 2011-10-14 ENCOUNTER — Other Ambulatory Visit: Payer: Self-pay | Admitting: Internal Medicine

## 2012-03-12 ENCOUNTER — Other Ambulatory Visit (INDEPENDENT_AMBULATORY_CARE_PROVIDER_SITE_OTHER): Payer: Federal, State, Local not specified - PPO

## 2012-03-12 ENCOUNTER — Telehealth: Payer: Self-pay | Admitting: *Deleted

## 2012-03-12 DIAGNOSIS — I10 Essential (primary) hypertension: Secondary | ICD-10-CM

## 2012-03-12 DIAGNOSIS — I4891 Unspecified atrial fibrillation: Secondary | ICD-10-CM

## 2012-03-12 LAB — LIPID PANEL
Cholesterol: 249 mg/dL — ABNORMAL HIGH (ref 0–200)
VLDL: 15.8 mg/dL (ref 0.0–40.0)

## 2012-03-12 LAB — HEPATIC FUNCTION PANEL
ALT: 22 U/L (ref 0–53)
AST: 20 U/L (ref 0–37)
Albumin: 4.2 g/dL (ref 3.5–5.2)
Alkaline Phosphatase: 40 U/L (ref 39–117)
Total Protein: 7.3 g/dL (ref 6.0–8.3)

## 2012-03-12 NOTE — Telephone Encounter (Signed)
Message copied by Tarri Fuller on Fri Mar 12, 2012  3:05 PM ------      Message from: [place], [place] T      Created: Fri Mar 12, 2012  1:52 PM       Is he still taking Crestor?      Tereso Newcomer, PA-C  1:52 PM 03/12/2012

## 2012-03-12 NOTE — Telephone Encounter (Signed)
lmptcb to go over lab results and verify if pt is taking crestor.

## 2012-03-15 ENCOUNTER — Telehealth: Payer: Self-pay | Admitting: Internal Medicine

## 2012-03-15 DIAGNOSIS — E785 Hyperlipidemia, unspecified: Secondary | ICD-10-CM

## 2012-03-15 MED ORDER — ROSUVASTATIN CALCIUM 40 MG PO TABS: 40.0000 mg | ORAL_TABLET | Freq: Every day | ORAL | Status: DC

## 2012-03-15 NOTE — Telephone Encounter (Signed)
pt states he tried to get his crestor refilled but no-one ever refilled it. I advised pt to go back on crestor and repeat  L/L 12/16, pt states ok, rx called in to [facility] [place] 

## 2012-03-15 NOTE — Telephone Encounter (Signed)
pt states he tried to get his crestor refilled but no-one ever refilled it. I advised pt to go back on crestor and repeat  L/L 12/16, pt states ok, rx called in to [facility]

## 2012-03-15 NOTE — Telephone Encounter (Signed)
New problem:  Returning Charles Mills called from Friday .

## 2012-03-23 ENCOUNTER — Ambulatory Visit (INDEPENDENT_AMBULATORY_CARE_PROVIDER_SITE_OTHER): Payer: Federal, State, Local not specified - PPO | Admitting: Internal Medicine

## 2012-03-23 ENCOUNTER — Encounter: Payer: Self-pay | Admitting: Internal Medicine

## 2012-03-23 VITALS — BP 114/84 | HR 105 | Ht 70.5 in | Wt 217.6 lb

## 2012-03-23 DIAGNOSIS — I10 Essential (primary) hypertension: Secondary | ICD-10-CM

## 2012-03-23 DIAGNOSIS — I4891 Unspecified atrial fibrillation: Secondary | ICD-10-CM

## 2012-03-23 NOTE — Patient Instructions (Signed)
Your physician wants you to follow-up in: 12 months with Dr. Taylor. You will receive a reminder letter in the mail two months in advance. If you don't receive a letter, please call our office to schedule the follow-up appointment.    

## 2012-03-23 NOTE — Assessment & Plan Note (Signed)
The patient's ventricular rate is up today but he notes that he was anxious and in a hurry on his way over to the office. He notes it at home is been stable and he is a very good exercise ability. For this reason, I am not changing his medical regimen but recommended watchful waiting. I've instructed him to call me if his ventricular rates remain elevated.

## 2012-03-23 NOTE — Progress Notes (Signed)
HPI [name] returns today for followup. He is a very pleasant 56 year old man with chronic atrial fibrillation and hypertension. In the interim, he has done well. He denies chest pain, shortness of breath, or peripheral edema. He is retired and now exercises regularly playing racquetball, running on the treadmill, and lifting weights. He has no limitations to his activity. He denies palpitations. He checks his heart rate regularly and notes that it typically is in the 60-70 bpm range. No Known Allergies   Current Outpatient Prescriptions  Medication Sig Dispense Refill  . aspirin EC 81 MG tablet Take 1 tablet (81 mg total) by mouth daily.  150 tablet  2  . Calcium Carbonate (CALCIUM 600 PO) Take 600 mg by mouth 2 (two) times daily.        . Cholecalciferol (VITAMIN D) 2000 UNITS CAPS Take 1 capsule by mouth 2 (two) times daily.        . Coenzyme Q10 (CO Q 10) 100 MG CAPS Take 100 mg by mouth every morning.        . cyanocobalamin 500 MCG tablet Take 500 mcg by mouth every morning.        Marland Kitchen lisinopril (PRINIVIL,ZESTRIL) 20 MG tablet Take 0.5 tablets (10 mg total) by mouth daily.  30 tablet  6  . metoprolol succinate (TOPROL-XL) 25 MG 24 hr tablet Take one by mouth every morning and two by mouth every evening  90 tablet  11  . nitroGLYCERIN (NITROSTAT) 0.4 MG SL tablet Place 0.4 mg under the tongue every 5 (five) minutes as needed. For chest pain      . Omega-3 Fatty Acids (FISH OIL) 1200 MG CAPS Take 1 capsule by mouth 2 (two) times daily.       Marland Kitchen OVER THE COUNTER MEDICATION Place 2 drops into both eyes every morning. Murine Eye Drops      . Probiotic Product (PROBIOTIC PO) Take 1 tablet by mouth every morning.        Marland Kitchen QUERCETIN PO Take 500 mg by mouth every morning.        . rosuvastatin (CRESTOR) 40 MG tablet Take 1 tablet (40 mg total) by mouth daily.  30 tablet  11  . Saw Palmetto, Serenoa repens, (SAW PALMETTO BERRIES) 540 MG CAPS Take 1 capsule by mouth 2 (two) times daily.        .  vitamin C (ASCORBIC ACID) 500 MG tablet Take 500 mg by mouth 2 (two) times daily.        . vitamin E 400 UNIT capsule Take 400 Units by mouth 2 (two) times daily.           Past Medical History  Diagnosis Date  . HTN (hypertension)   . Atrial fibrillation     Echocardiogram 05/20/11: EF 55-65%, moderate LAE, mild RAE;  failed DCCV 06/2011;  Flecainided started 07/2011;  DCCV 08/06/11  . Mild obstructive sleep apnea     saw Dr. Shelle Iron 3/13:  conservative mgmt for now  . HLD (hyperlipidemia)     ROS:   All systems reviewed and negative except as noted in the HPI.   Past Surgical History  Procedure Date  . Tonsillectomy   . Cardioversion 07/02/2011    Procedure: CARDIOVERSION;  Surgeon: Wendall Stade, MD;  Location: [facility] OR;  Service: Cardiovascular;  Laterality: N/A;  . Cardioversion 08/06/2011    Procedure: CARDIOVERSION;  Surgeon: [name], MD;  Location: [facility] OR;  Service: Cardiovascular;  Laterality: N/A;  No family history on file.   History   Social History  . Marital Status: Divorced    Spouse Name: N/A    Number of Children: N/A  . Years of Education: N/A   Occupational History  . 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 Smoking status: Never Smoker   . Smokeless tobacco: Not on file  . Alcohol Use: Yes     occassionally  . Drug Use: No  . Sexually Active:    Other Topics Concern  . Not on file   Social History Narrative   Divorced. Now single. Lives with [facility].      BP 114/84  Pulse 105  Ht 5' 10.5" (1.791 m)  Wt 217 lb 9.6 oz (98.703 kg)  BMI 30.78 kg/m2  Physical Exam:  Well appearing middle-aged man, NAD HEENT: Unremarkable Neck:  No JVD, no thyromegally Lungs:  Clear with no wheezes, rales, or rhonchi. HEART:  IRegular rate rhythm, no murmurs, no rubs, no clicks Abd:  soft, positive bowel sounds, no organomegally, no rebound, no guarding Ext:  2 plus pulses, no edema, no cyanosis, no  clubbing Skin:  No rashes no nodules Neuro:  CN II through XII intact, motor grossly intact  EKG Atrial fibrillation with a rapid ventricular response  Assess/Plan:

## 2012-03-23 NOTE — Assessment & Plan Note (Signed)
His blood pressure is well controlled. He is instructed to maintain a low-sodium diet and continue his current medical therapy.

## 2012-05-31 ENCOUNTER — Other Ambulatory Visit (INDEPENDENT_AMBULATORY_CARE_PROVIDER_SITE_OTHER): Payer: Federal, State, Local not specified - PPO

## 2012-05-31 DIAGNOSIS — E785 Hyperlipidemia, unspecified: Secondary | ICD-10-CM

## 2012-05-31 LAB — HEPATIC FUNCTION PANEL
AST: 26 U/L (ref 0–37)
Alkaline Phosphatase: 34 U/L — ABNORMAL LOW (ref 39–117)
Bilirubin, Direct: 0 mg/dL (ref 0.0–0.3)
Total Bilirubin: 0.8 mg/dL (ref 0.3–1.2)

## 2012-05-31 LAB — LIPID PANEL
Total CHOL/HDL Ratio: 3
Triglycerides: 62 mg/dL (ref 0.0–149.0)

## 2012-06-01 ENCOUNTER — Telehealth: Payer: Self-pay | Admitting: *Deleted

## 2012-06-01 NOTE — Telephone Encounter (Signed)
pt notified about lab results w/verbal understanding today 

## 2012-06-01 NOTE — Telephone Encounter (Signed)
Message copied by Tarri Fuller on Tue Jun 01, 2012 11:33 AM ------      Message from: [place], [place] T      Created: Tue Jun 01, 2012  8:14 AM       Lipids look good.      Continue with current treatment plan.      Tereso Newcomer, PA-C  8:14 AM 06/01/2012

## 2012-07-07 ENCOUNTER — Other Ambulatory Visit: Payer: Self-pay

## 2012-07-07 MED ORDER — METOPROLOL SUCCINATE ER 25 MG PO TB24: ORAL_TABLET | ORAL | Status: DC

## 2012-07-22 ENCOUNTER — Other Ambulatory Visit: Payer: Self-pay | Admitting: Physician Assistant

## 2013-01-28 ENCOUNTER — Other Ambulatory Visit: Payer: Self-pay | Admitting: Internal Medicine

## 2013-02-22 ENCOUNTER — Other Ambulatory Visit: Payer: Self-pay | Admitting: Physician Assistant

## 2013-03-21 NOTE — H&P (Signed)
Charles Bensimhon,MD 1:05 PM   

## 2013-03-31 ENCOUNTER — Ambulatory Visit (INDEPENDENT_AMBULATORY_CARE_PROVIDER_SITE_OTHER): Payer: Federal, State, Local not specified - PPO | Admitting: Internal Medicine

## 2013-03-31 ENCOUNTER — Encounter: Payer: Self-pay | Admitting: Internal Medicine

## 2013-03-31 VITALS — BP 132/94 | HR 115 | Ht 70.5 in | Wt 217.4 lb

## 2013-03-31 DIAGNOSIS — I4891 Unspecified atrial fibrillation: Secondary | ICD-10-CM

## 2013-03-31 DIAGNOSIS — I10 Essential (primary) hypertension: Secondary | ICD-10-CM

## 2013-03-31 DIAGNOSIS — E785 Hyperlipidemia, unspecified: Secondary | ICD-10-CM

## 2013-03-31 NOTE — Assessment & Plan Note (Signed)
His blood pressure appears to be well-controlled. I've encouraged the patient to maintain a low-sodium diet. He will continue his regular daily exercise.

## 2013-03-31 NOTE — Patient Instructions (Signed)
Your physician has recommended that you wear a 48 hour holter monitor. Holter monitors are medical devices that record the heart's electrical activity. Doctors most often use these monitors to diagnose arrhythmias. Arrhythmias are problems with the speed or rhythm of the heartbeat. The monitor is a small, portable device. You can wear one while you do your normal daily activities. This is usually used to diagnose what is causing palpitations/syncope (passing out).  Your physician wants you to follow-up in: one year with Dr. Ladona Ridgel. You will receive a reminder letter in the mail two months in advance. If you don't receive a letter, please call our office to schedule the follow-up appointment.  (Depending on your results, he may need to see you sooner - the office will call you if this is necessary)  Your physician recommends that you continue on your current medications as directed. Please refer to the Current Medication list given to you today.

## 2013-03-31 NOTE — Progress Notes (Signed)
HPI Charles Mills returns today for followup. He is a very pleasant 57 year old man with chronic atrial fibrillation and hypertension. In the interim, he has done well. He denies chest pain, shortness of breath, or peripheral edema. He is retired and exercises regularly playing racquetball, walking on the treadmill, and lifting weights. He has no limitations to his activity. He denies palpitations. He checks his heart rate regularly and notes that it typically is in the 60-70 bpm range. No Known Allergies   Current Outpatient Prescriptions  Medication Sig Dispense Refill  . aspirin EC 81 MG tablet Take 1 tablet (81 mg total) by mouth daily.  150 tablet  2  . Calcium Carbonate (CALCIUM 600 PO) Take 600 mg by mouth 2 (two) times daily.        . Cholecalciferol (VITAMIN D) 2000 UNITS CAPS Take 1 capsule by mouth 2 (two) times daily.        . Coenzyme Q10 (CO Q 10) 100 MG CAPS Take 100 mg by mouth every morning.        Marland Kitchen CRESTOR 40 MG tablet TAKE 1 TABLET BY MOUTH EVERY DAY  30 tablet  3  . cyanocobalamin 500 MCG tablet Take 500 mcg by mouth every morning.        Marland Kitchen lisinopril (PRINIVIL,ZESTRIL) 20 MG tablet TAKE 1/2 TABLET BY MOUTH EVERY DAY  30 tablet  3  . metoprolol succinate (TOPROL-XL) 25 MG 24 hr tablet Take one by mouth every morning and two by mouth every evening  90 tablet  11  . Omega-3 Fatty Acids (FISH OIL) 1200 MG CAPS Take 1 capsule by mouth 2 (two) times daily.       Marland Kitchen OVER THE COUNTER MEDICATION Place 2 drops into both eyes every morning. Murine Eye Drops      . Probiotic Product (PROBIOTIC PO) Take 1 tablet by mouth every morning.        Marland Kitchen QUERCETIN PO Take 500 mg by mouth every morning.        . Saw Palmetto, Serenoa repens, (SAW PALMETTO BERRIES) 540 MG CAPS Take 1 capsule by mouth 2 (two) times daily.        . vitamin C (ASCORBIC ACID) 500 MG tablet Take 500 mg by mouth 2 (two) times daily.        . vitamin E 400 UNIT capsule Take 400 Units by mouth 2 (two) times daily.        .  nitroGLYCERIN (NITROSTAT) 0.4 MG SL tablet Place 0.4 mg under the tongue every 5 (five) minutes as needed. For chest pain       No current facility-administered medications for this visit.     Past Medical History  Diagnosis Date  . HTN (hypertension)   . Atrial fibrillation     Echocardiogram 05/20/11: EF 55-65%, moderate LAE, mild RAE;  failed DCCV 06/2011;  Flecainided started 07/2011;  DCCV 08/06/11  . Mild obstructive sleep apnea     saw Dr. Shelle Iron 3/13:  conservative mgmt for now  . HLD (hyperlipidemia)     ROS:   All systems reviewed and negative except as noted in the HPI.   Past Surgical History  Procedure Laterality Date  . Tonsillectomy    . Cardioversion  07/02/2011    Procedure: CARDIOVERSION;  Surgeon: Wendall Stade, MD;  Location: [facility] OR;  Service: Cardiovascular;  Laterality: N/A;  . Cardioversion  08/06/2011    Procedure: CARDIOVERSION;  Surgeon: [name], MD;  Location: [facility] OR;  Service: Cardiovascular;  Laterality: N/A;     No family history on file.   History   Social History  . Marital Status: Divorced    Spouse Name: N/A    Number of Children: N/A  . Years of Education: N/A   Occupational History  . 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 Smoking status: Never Smoker   . Smokeless tobacco: Not on file  . Alcohol Use: Yes     Comment: occassionally  . Drug Use: No  . Sexual Activity:    Other Topics Concern  . Not on file   Social History Narrative   Divorced. Now single. Lives with [facility]      BP 132/94  Pulse 115  Ht 5' 10.5" (1.791 m)  Wt 217 lb 6.4 oz (98.612 kg)  BMI 30.74 kg/m2  Physical Exam:  Well appearing middle-aged man, NAD HEENT: Unremarkable Neck:  No JVD, no thyromegally Lungs:  Clear with no wheezes, rales, or rhonchi. HEART:  IRegular rate rhythm, no murmurs, no rubs, no clicks Abd:  soft, positive bowel sounds, no organomegally, no rebound, no guarding Ext:   2 plus pulses, no edema, no cyanosis, no clubbing Skin:  No rashes no nodules Neuro:  CN II through XII intact, motor grossly intact  EKG Atrial fibrillation with a rapid ventricular response  Assess/Plan:

## 2013-03-31 NOTE — Assessment & Plan Note (Signed)
His weight is good. He will continue his statin drugs. He'll maintain a low-fat diet.

## 2013-03-31 NOTE — Assessment & Plan Note (Signed)
The patient is asymptomatic with regard to his atrial fibrillation. Unfortunately his ventricular rate is elevated and I am concerned about this. Review his records from a year ago demonstrates a rapid ventricular rate. The patient states that at home his pulse rate is actually usually 100 beats per minute or less. I've asked the patient undergo Holter monitoring so that we can evaluate his average heart rate and how well his ventricular rate is controlled in atrial fibrillation on beta blocker therapy. Additional followup will depend on the results of his cardiac monitor. For now no change in his medical therapy.

## 2013-04-11 ENCOUNTER — Encounter: Payer: Self-pay | Admitting: Radiology

## 2013-04-11 ENCOUNTER — Encounter (INDEPENDENT_AMBULATORY_CARE_PROVIDER_SITE_OTHER): Payer: Federal, State, Local not specified - PPO

## 2013-04-11 DIAGNOSIS — I4891 Unspecified atrial fibrillation: Secondary | ICD-10-CM

## 2013-04-11 NOTE — Progress Notes (Signed)
Patient ID: Charles Mills, male   DOB: 1955-06-26, 57 y.o.   MRN: [id number] E cardio 48hr holter monitor applied

## 2013-04-27 ENCOUNTER — Telehealth: Payer: Self-pay | Admitting: Internal Medicine

## 2013-04-27 DIAGNOSIS — I4891 Unspecified atrial fibrillation: Secondary | ICD-10-CM

## 2013-04-27 NOTE — Telephone Encounter (Signed)
New problem     pt dropped of 10/29 monitor.   Pt needs a call back with results.

## 2013-04-27 NOTE — Telephone Encounter (Signed)
Spoke with patient and let him know Dr Ladona Ridgel will review his monitor tomorrow and I will call him

## 2013-04-28 MED ORDER — METOPROLOL TARTRATE 50 MG PO TABS: 50.0000 mg | ORAL_TABLET | Freq: Two times a day (BID) | ORAL | Status: DC

## 2013-04-28 NOTE — Telephone Encounter (Signed)
Dr Ladona Ridgel reviewed his monitor and wants him to stop Metoprolol Succ and start  Metoprolol Tarr 50mg  bid  After he has been on the medicatin for 3 weeks he will need a GXT with Dr Ladona Ridgel  Patient is aware

## 2013-05-16 ENCOUNTER — Ambulatory Visit (INDEPENDENT_AMBULATORY_CARE_PROVIDER_SITE_OTHER): Payer: Federal, State, Local not specified - PPO | Admitting: Internal Medicine

## 2013-05-16 DIAGNOSIS — I4891 Unspecified atrial fibrillation: Secondary | ICD-10-CM

## 2013-05-16 NOTE — Progress Notes (Signed)
Exercise Treadmill Test  Pre-Exercise Testing Evaluation Rhythm: atrial fibrillation  Rate: 110     Test  Exercise Tolerance Test Ordering MD: [name], MD  Interpreting MD: [name], MD  Unique Test No: 1  Treadmill:  1  Indication for ETT: Atrial Fibrillation  Contraindication to ETT: No   Stress Modality: exercise - treadmill  Cardiac Imaging Performed: non   Protocol: standard Bruce - maximal  Max BP:  132/73  Max MPHR (bpm):  235 85% MPR (bpm):  00:25  MPHR obtained (bpm):  163 % MPHR obtained:  139  Reached 85% MPHR (min:sec):  00:25 Total Exercise Time (min-sec):  9:00  Workload in METS:  10.1 Borg Scale: 13  Reason ETT Terminated:  desired heart rate attained    ST Segment Analysis At Rest: non-specific ST segment slurring With Exercise: significant ischemic ST depression  Other Information Arrhythmia:  No Angina during ETT:  absent (0) Quality of ETT:  diagnostic  ETT Interpretation:  abnormal - evidence of ST depression consistent with ischemia  Comments: Clinically negative,electrive positive  Recommendations: Will uptitrate beta blocker to 75 mg nightly and 50 mg daily.

## 2013-05-27 ENCOUNTER — Other Ambulatory Visit: Payer: Self-pay | Admitting: *Deleted

## 2013-05-27 MED ORDER — METOPROLOL TARTRATE 50 MG PO TABS: 75.0000 mg | ORAL_TABLET | Freq: Two times a day (BID) | ORAL | Status: DC

## 2013-06-21 ENCOUNTER — Other Ambulatory Visit: Payer: Self-pay | Admitting: Internal Medicine

## 2013-07-31 ENCOUNTER — Other Ambulatory Visit: Payer: Self-pay | Admitting: Internal Medicine

## 2013-08-02 ENCOUNTER — Ambulatory Visit: Payer: Federal, State, Local not specified - PPO | Admitting: Internal Medicine

## 2013-08-09 ENCOUNTER — Ambulatory Visit: Payer: Federal, State, Local not specified - PPO | Admitting: Internal Medicine

## 2013-08-18 ENCOUNTER — Encounter: Payer: Self-pay | Admitting: Internal Medicine

## 2013-08-18 ENCOUNTER — Encounter (INDEPENDENT_AMBULATORY_CARE_PROVIDER_SITE_OTHER): Payer: Self-pay

## 2013-08-18 ENCOUNTER — Ambulatory Visit (INDEPENDENT_AMBULATORY_CARE_PROVIDER_SITE_OTHER): Payer: Federal, State, Local not specified - PPO | Admitting: Internal Medicine

## 2013-08-18 VITALS — BP 152/92 | HR 73 | Ht 70.5 in | Wt 208.0 lb

## 2013-08-18 DIAGNOSIS — N529 Male erectile dysfunction, unspecified: Secondary | ICD-10-CM | POA: Insufficient documentation

## 2013-08-18 DIAGNOSIS — I4891 Unspecified atrial fibrillation: Secondary | ICD-10-CM

## 2013-08-18 DIAGNOSIS — I10 Essential (primary) hypertension: Secondary | ICD-10-CM

## 2013-08-18 MED ORDER — TADALAFIL 20 MG PO TABS: 20.0000 mg | ORAL_TABLET | Freq: Every day | ORAL | Status: AC | PRN

## 2013-08-18 NOTE — Assessment & Plan Note (Signed)
His ventricular rates are now controlled. I have asked that the patient continue his current dose of beta blocker. He is still low risk for stroke with isolated HTN as his only stroke risk factor.

## 2013-08-18 NOTE — Assessment & Plan Note (Signed)
His blood pressure is up today but he notes that at home it is not elevated. He is encouraged to maintain a low sodium diet.

## 2013-08-18 NOTE — Assessment & Plan Note (Signed)
This is a new complaint and is exacerbated by his beta blocker which he needs. I have asked the patient to try some cialis. He is instructed never to use any nitrates.

## 2013-08-18 NOTE — Progress Notes (Signed)
HPI Charles Mills returns today for followup. He is a very pleasant 58 year old man with chronic atrial fibrillation and hypertension. In the interim, he has done well. He denies chest pain, shortness of breath, or peripheral edema. He is retired and exercises regularly playing racquetball, walking on the treadmill, and lifting weights. He has no limitations to his activity. He denies palpitations. He checks his heart rate regularly and notes that it typically is in the 60-70 bpm range. Previously he had very difficult to control ventricular rates. Finally, he has experienced problems with ED.  No Known Allergies   Current Outpatient Prescriptions  Medication Sig Dispense Refill  . aspirin EC 81 MG tablet Take 1 tablet (81 mg total) by mouth daily.  150 tablet  2  . Calcium Carbonate (CALCIUM 600 PO) Take 600 mg by mouth 2 (two) times daily.        . Cholecalciferol (VITAMIN D) 2000 UNITS CAPS Take 1 capsule by mouth 2 (two) times daily.        . Coenzyme Q10 (CO Q 10) 100 MG CAPS Take 100 mg by mouth every morning.        Marland Kitchen CRESTOR 40 MG tablet TAKE 1 TABLET BY MOUTH EVERY DAY  30 tablet  5  . cyanocobalamin 500 MCG tablet Take 500 mcg by mouth every morning.        . metoprolol (LOPRESSOR) 50 MG tablet Take 75 mg by mouth every evening.      . nitroGLYCERIN (NITROSTAT) 0.4 MG SL tablet Place 0.4 mg under the tongue every 5 (five) minutes as needed. For chest pain      . Omega-3 Fatty Acids (FISH OIL) 1200 MG CAPS Take 1 capsule by mouth 2 (two) times daily.       Marland Kitchen OVER THE COUNTER MEDICATION Place 2 drops into both eyes every morning. Murine Eye Drops      . Probiotic Product (PROBIOTIC PO) Take 1 tablet by mouth every morning.        Marland Kitchen QUERCETIN PO Take 500 mg by mouth every morning.        . Saw Palmetto, Serenoa repens, (SAW PALMETTO BERRIES) 540 MG CAPS Take 1 capsule by mouth 2 (two) times daily.        . vitamin C (ASCORBIC ACID) 500 MG tablet Take 500 mg by mouth 2 (two) times daily.         . vitamin E 400 UNIT capsule Take 400 Units by mouth 2 (two) times daily.        . tadalafil (CIALIS) 20 MG tablet Take 1 tablet (20 mg total) by mouth daily as needed for erectile dysfunction.  10 tablet  3   No current facility-administered medications for this visit.     Past Medical History  Diagnosis Date  . HTN (hypertension)   . Atrial fibrillation     Echocardiogram 05/20/11: EF 55-65%, moderate LAE, mild RAE;  failed DCCV 06/2011;  Flecainided started 07/2011;  DCCV 08/06/11  . Mild obstructive sleep apnea     saw Dr. Shelle Iron 3/13:  conservative mgmt for now  . HLD (hyperlipidemia)     ROS:   All systems reviewed and negative except as noted in the HPI.   Past Surgical History  Procedure Laterality Date  . Tonsillectomy    . Cardioversion  07/02/2011    Procedure: CARDIOVERSION;  Surgeon: Wendall Stade, MD;  Location: [facility] OR;  Service: Cardiovascular;  Laterality: N/A;  . Cardioversion  08/06/2011  Procedure: CARDIOVERSION;  Surgeon: Lewayne BuntingGregg Taylor, MD;  [facility];  Service: Cardiovascular;  Laterality: N/A;     No family history on file.   History   Social History  . Marital Status: Divorced    Spouse Name: N/A    Number of Children: N/A  . Years of Education: N/A   Occupational History  . retired     Corporate treasurerprev worked as an Pharmacist, hospitalexec. w/ Research officer, political partypostal service   Social History Main Topics  . Smoking status: Never Smoker   . Smokeless tobacco: Not on file  . Alcohol Use: Yes     Comment: occassionally  . Drug Use: No  . Sexual Activity:    Other Topics Concern  . Not on file   Social History Narrative   Divorced. Now single. [facility]      BP 152/92  Pulse 73  Ht 5' 10.5" (1.791 m)  Wt 208 lb (94.348 kg)  BMI 29.41 kg/m2  Physical Exam:  Well appearing middle-aged man, NAD HEENT: Unremarkable Neck:  No JVD, no thyromegally Lungs:  Clear with no wheezes, rales, or rhonchi. HEART:  IRegular rate rhythm, no murmurs, no rubs, no clicks Abd:   soft, positive bowel sounds, no organomegally, no rebound, no guarding Ext:  2 plus pulses, no edema, no cyanosis, no clubbing Skin:  No rashes no nodules Neuro:  CN II through XII intact, motor grossly intact  EKG Atrial fibrillation with a controlled ventricular response  Assess/Plan:

## 2013-08-18 NOTE — Patient Instructions (Addendum)
Your physician wants you to follow-up in: 12 months with Dr Court Joyaylor You will receive a reminder letter in the mail two months in advance. If you don't receive a letter, please call our office to schedule the follow-up appointment.   Your physician has recommended you make the following change in your medication:  1) Take Cialis 20mg  as needed

## 2014-01-14 ENCOUNTER — Other Ambulatory Visit: Payer: Self-pay | Admitting: Internal Medicine

## 2014-07-06 ENCOUNTER — Other Ambulatory Visit: Payer: Self-pay | Admitting: Internal Medicine

## 2014-07-10 ENCOUNTER — Other Ambulatory Visit: Payer: Self-pay

## 2014-07-20 ENCOUNTER — Encounter: Payer: Self-pay | Admitting: Internal Medicine

## 2014-08-02 ENCOUNTER — Other Ambulatory Visit: Payer: Self-pay | Admitting: Internal Medicine

## 2014-08-10 ENCOUNTER — Other Ambulatory Visit: Payer: Self-pay | Admitting: Internal Medicine

## 2014-08-22 ENCOUNTER — Ambulatory Visit: Payer: Federal, State, Local not specified - PPO | Admitting: Internal Medicine

## 2014-08-29 ENCOUNTER — Ambulatory Visit (INDEPENDENT_AMBULATORY_CARE_PROVIDER_SITE_OTHER): Payer: Federal, State, Local not specified - PPO | Admitting: Internal Medicine

## 2014-08-29 ENCOUNTER — Encounter: Payer: Self-pay | Admitting: Internal Medicine

## 2014-08-29 VITALS — BP 148/86 | HR 95 | Ht 70.5 in | Wt 216.6 lb

## 2014-08-29 DIAGNOSIS — N5201 Erectile dysfunction due to arterial insufficiency: Secondary | ICD-10-CM

## 2014-08-29 DIAGNOSIS — I4891 Unspecified atrial fibrillation: Secondary | ICD-10-CM

## 2014-08-29 DIAGNOSIS — I10 Essential (primary) hypertension: Secondary | ICD-10-CM

## 2014-08-29 MED ORDER — NITROGLYCERIN 0.4 MG SL SUBL: 0.4000 mg | SUBLINGUAL_TABLET | SUBLINGUAL | Status: DC | PRN

## 2014-08-29 MED ORDER — AMIODARONE HCL 200 MG PO TABS
100.0000 mg | ORAL_TABLET | Freq: Two times a day (BID) | ORAL | Status: DC
Start: 2014-08-29 — End: 2014-08-29

## 2014-08-29 NOTE — Assessment & Plan Note (Signed)
His blood pressure is elevated today. He notes that at home it is well controlled. No change in meds.

## 2014-08-29 NOTE — Assessment & Plan Note (Signed)
His ventricular rate is well controlled. He will continue his current medical therapy. 

## 2014-08-29 NOTE — Progress Notes (Signed)
HPI [name] returns today for followup. He is a very pleasant 59 year old man with chronic atrial fibrillation and hypertension. In the interim, he has done well. He denies chest pain, shortness of breath, or peripheral edema. He is retired and exercises regularly playing racquetball, walking on the treadmill, and lifting weights. He has no limitations to his activity. He denies palpitations. He checks his heart rate regularly and notes that it typically is in the 70-80 bpm range. His ED has improved. No Known Allergies   Current Outpatient Prescriptions  Medication Sig Dispense Refill  . aspirin EC 81 MG tablet Take 1 tablet (81 mg total) by mouth daily. 150 tablet 2  . Calcium Carbonate (CALCIUM 600 PO) Take 600 mg by mouth 2 (two) times daily.      . Cholecalciferol (VITAMIN D) 2000 UNITS CAPS Take 1 capsule by mouth 2 (two) times daily.      . Coenzyme Q10 (CO Q 10) 100 MG CAPS Take 100 mg by mouth every morning.      Marland Kitchen. CRESTOR 40 MG tablet TAKE 1 TABLET BY MOUTH EVERY DAY 30 tablet 3  . cyanocobalamin 500 MCG tablet Take 500 mcg by mouth every morning.      . metoprolol (LOPRESSOR) 50 MG tablet Take 1 tablet by mouth in the morning and take 1.5 tablets by mouth in the evening    . nitroGLYCERIN (NITROSTAT) 0.4 MG SL tablet Place 1 tablet (0.4 mg total) under the tongue every 5 (five) minutes as needed (MAX 3 TABLETS). For chest pain 25 tablet 11  . Omega-3 Fatty Acids (FISH OIL) 1200 MG CAPS Take 1 capsule by mouth 2 (two) times daily.     Marland Kitchen. OVER THE COUNTER MEDICATION Place 2 drops into both eyes every morning. Murine Eye Drops    . Probiotic Product (PROBIOTIC PO) Take 1 tablet by mouth every morning.      Marland Kitchen. QUERCETIN PO Take 500 mg by mouth every morning.      . Saw Palmetto, Serenoa repens, (SAW PALMETTO BERRIES) 540 MG CAPS Take 1 capsule by mouth 2 (two) times daily.      . tadalafil (CIALIS) 20 MG tablet Take 1 tablet (20 mg total) by mouth daily as needed for erectile dysfunction.  10 tablet 3  . vitamin C (ASCORBIC ACID) 500 MG tablet Take 500 mg by mouth 2 (two) times daily.      . vitamin E 400 UNIT capsule Take 400 Units by mouth 2 (two) times daily.       No current facility-administered medications for this visit.     Past Medical History  Diagnosis Date  . HTN (hypertension)   . Atrial fibrillation     Echocardiogram 05/20/11: EF 55-65%, moderate LAE, mild RAE;  failed DCCV 06/2011;  Flecainided started 07/2011;  DCCV 08/06/11  . Mild obstructive sleep apnea     saw Dr. Shelle Ironlance 3/13:  conservative mgmt for now  . HLD (hyperlipidemia)     ROS:   All systems reviewed and negative except as noted in the HPI.   Past Surgical History  Procedure Laterality Date  . Tonsillectomy    . Cardioversion  07/02/2011    Procedure: CARDIOVERSION;  Surgeon: Wendall StadePeter C Nishan, MD;  [facility];  Service: Cardiovascular;  Laterality: N/A;  . Cardioversion  08/06/2011    Procedure: CARDIOVERSION;  Surgeon: Lewayne BuntingGregg Taylor, MD;  [facility];  Service: Cardiovascular;  Laterality: N/A;     Family History  Problem Relation Age  of Onset  . Cancer - Lung Mother   . Raynaud syndrome Mother   . Hypertension Brother   . Diabetes Maternal Grandmother      History   Social History  . Marital Status: Divorced    Spouse Name: N/A  . Number of Children: N/A  . Years of Education: N/A   Occupational History  . 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 Smoking status: Never Smoker   . Smokeless tobacco: Not on file  . Alcohol Use: Yes     Comment: occassionally  . Drug Use: No  . Sexual Activity: Not on file   Other Topics Concern  . Not on file   Social History Narrative   Divorced. Now single. [facility].      BP 148/86 mmHg  Pulse 95  Ht 5' 10.5" (1.791 m)  Wt 216 lb 9.6 oz (98.249 kg)  BMI 30.63 kg/m2  Physical Exam:  Well appearing middle-aged man, NAD HEENT: Unremarkable Neck:  No JVD, no  thyromegally Lungs:  Clear with no wheezes, rales, or rhonchi. HEART:  IRegular rate rhythm, no murmurs, no rubs, no clicks Abd:  soft, positive bowel sounds, no organomegally, no rebound, no guarding Ext:  2 plus pulses, no edema, no cyanosis, no clubbing Skin:  No rashes no nodules Neuro:  CN II through XII intact, motor grossly intact  EKG Atrial fibrillation with a controlled ventricular response  Assess/Plan:

## 2014-08-29 NOTE — Patient Instructions (Addendum)
Your physician wants you to follow-up in: 12 months with Dr. Taylor. You will receive a reminder letter in the mail two months in advance. If you don't receive a letter, please call our office to schedule the follow-up appointment.    

## 2014-08-29 NOTE — Assessment & Plan Note (Signed)
He notes that this problem is better and he has required Cialis rarely.

## 2014-11-14 ENCOUNTER — Ambulatory Visit (INDEPENDENT_AMBULATORY_CARE_PROVIDER_SITE_OTHER): Payer: Federal, State, Local not specified - PPO

## 2014-11-14 ENCOUNTER — Ambulatory Visit (INDEPENDENT_AMBULATORY_CARE_PROVIDER_SITE_OTHER): Payer: Federal, State, Local not specified - PPO | Admitting: Internal Medicine

## 2014-11-14 ENCOUNTER — Telehealth: Payer: Self-pay | Admitting: Internal Medicine

## 2014-11-14 VITALS — BP 128/82 | HR 88 | Temp 99.3°F | Resp 20 | Ht 69.0 in | Wt 212.2 lb

## 2014-11-14 DIAGNOSIS — D72829 Elevated white blood cell count, unspecified: Secondary | ICD-10-CM

## 2014-11-14 DIAGNOSIS — I482 Chronic atrial fibrillation, unspecified: Secondary | ICD-10-CM

## 2014-11-14 DIAGNOSIS — R509 Fever, unspecified: Secondary | ICD-10-CM | POA: Diagnosis not present

## 2014-11-14 LAB — POCT URINALYSIS DIPSTICK
Bilirubin, UA: NEGATIVE
Blood, UA: NEGATIVE
Glucose, UA: NEGATIVE
LEUKOCYTES UA: NEGATIVE
NITRITE UA: NEGATIVE
Spec Grav, UA: 1.01
UROBILINOGEN UA: 0.2
pH, UA: 5.5

## 2014-11-14 LAB — POCT UA - MICROSCOPIC ONLY
Casts, Ur, LPF, POC: NEGATIVE
Crystals, Ur, HPF, POC: NEGATIVE
MUCUS UA: NEGATIVE
Yeast, UA: NEGATIVE

## 2014-11-14 LAB — POCT CBC
Granulocyte percent: 77.3 %G (ref 37–80)
HCT, POC: 42.3 % — AB (ref 43.5–53.7)
Hemoglobin: 14.2 g/dL (ref 14.1–18.1)
LYMPH, POC: 3.3 (ref 0.6–3.4)
MCH: 29.6 pg (ref 27–31.2)
MCHC: 33.5 g/dL (ref 31.8–35.4)
MCV: 88.2 fL (ref 80–97)
MID (cbc): 1 — AB (ref 0–0.9)
MPV: 6.2 fL (ref 0–99.8)
POC Granulocyte: 14.5 — AB (ref 2–6.9)
POC LYMPH %: 17.5 % (ref 10–50)
POC MID %: 5.2 %M (ref 0–12)
Platelet Count, POC: 430 10*3/uL — AB (ref 142–424)
RBC: 4.8 M/uL (ref 4.69–6.13)
RDW, POC: 12.4 %
WBC: 18.7 10*3/uL — AB (ref 4.6–10.2)

## 2014-11-14 MED ORDER — LEVOFLOXACIN 500 MG PO TABS: 500.0000 mg | ORAL_TABLET | Freq: Every day | ORAL | Status: DC

## 2014-11-14 NOTE — Progress Notes (Signed)
Subjective:    Patient ID: Charles Mills, male    DOB: January 08, 1956, 59 y.o.   MRN: [id number] This chart was scribed for Ellamae Sia, MD by Jolene Provost, Medical Scribe. This patient was seen in Room 12 and the patient's care was started a 9:08 PM.  Chief Complaint  Patient presents with  . Fever    Low Grade  . Fatigue  . Insect Bite  . Atrial Fibrillation    HPI HPI Comments: Charles Mills is a 59 y.o. male with a hx of atrial fibrillation who presents to [facility] complaining of fever for the last week. Pt states his fever has been worse at night and he's had night sweats. Pt also states his heart has been beating abnormally fast. Pt states his fever was 101.7 at highest. Pt states that he had what he suspected to be insect bites one week ago on his neck and right arm around the same time that his fever developed. This would have been from an indoor exposure and in fact he did kill a spider the day before he  noticed the rash. Pt states the bites never itched, but  when he pushed on them they hurt. Never had discharge or pus accumulation.  Pt denies diarrhea, nausea, vomiting, sore throat cough, dysuria, urinary frequency, urinary urgency, abdominal pain, or trouble initiating stream. Pt denies unusual travel recently.   He has chronic atrial fibrillation controlled by medication but has had lots of palpitations since the fever started--- no chest pain or shortness of breath  Pt is retired and states he spends little time outside. Nonsmoker No history of immunocompromise  Review of Systems  Constitutional: Positive for fever, diaphoresis and fatigue. Negative for chills, appetite change and unexpected weight change.       Has continued to play racquetball although gets tired more easily but not short of breath  HENT: Negative for ear pain, mouth sores, postnasal drip, rhinorrhea, sore throat and trouble swallowing.   Eyes: Negative for photophobia, discharge and visual disturbance.    Respiratory: Negative for cough, chest tightness, shortness of breath and wheezing.   Cardiovascular: Positive for palpitations. Negative for chest pain and leg swelling.  Gastrointestinal: Negative for nausea, vomiting, abdominal pain and diarrhea.  Genitourinary: Negative for dysuria, urgency, frequency, flank pain and difficulty urinating.  Musculoskeletal: Negative for myalgias, back pain, joint swelling and neck pain.  Skin: Negative for rash.  Neurological: Negative for headaches.       Objective:   Physical Exam  Constitutional: He is oriented to person, place, and time. He appears well-developed and well-nourished. No distress.  HENT:  Head: Normocephalic and atraumatic.  Right Ear: External ear normal.  Left Ear: External ear normal.  Nose: Nose normal.  Mouth/Throat: Oropharynx is clear and moist. No oropharyngeal exudate.  Eyes: Conjunctivae and EOM are normal. Pupils are equal, round, and reactive to light. Right eye exhibits no discharge. Left eye exhibits no discharge.  Neck: Normal range of motion. Neck supple. No thyromegaly present.  Cardiovascular: Normal rate and normal heart sounds.   No murmur heard. Irregular, no murmur  Pulmonary/Chest: Effort normal and breath sounds normal. No respiratory distress. He has no wheezes.  He had faint rales at the right base with first deep inspiration but this cleared thereafter  Abdominal: Soft. He exhibits no distension and no mass. There is no tenderness. There is no guarding.  No hepatosplenomegaly  Musculoskeletal: Normal range of motion. He exhibits no edema or tenderness.  Lymphadenopathy:  He has no cervical adenopathy.  Neurological: He is alert and oriented to person, place, and time. He has normal reflexes. No cranial nerve deficit. Coordination normal.  Skin: Skin is warm and dry. He is not diaphoretic.  A few red papules on his upper chest and right arm.  Psychiatric: He has a normal mood and affect. His  behavior is normal.  Nursing note and vitals reviewed.  Results for orders placed or performed in visit on 11/14/14  POCT CBC  Result Value Ref Range   WBC 18.7 (A) 4.6 - 10.2 K/uL   Lymph, poc 3.3 0.6 - 3.4   POC LYMPH PERCENT 17.5 10 - 50 %L   MID (cbc) 1.0 (A) 0 - 0.9   POC MID % 5.2 0 - 12 %M   POC Granulocyte 14.5 (A) 2 - 6.9   Granulocyte percent 77.3 37 - 80 %G   RBC 4.80 4.69 - 6.13 M/uL   Hemoglobin 14.2 14.1 - 18.1 g/dL   HCT, POC 16.142.3 (A) 09.643.5 - 53.7 %   MCV 88.2 80 - 97 fL   MCH, POC 29.6 27 - 31.2 pg   MCHC 33.5 31.8 - 35.4 g/dL   RDW, POC 04.512.4 %   Platelet Count, POC 430 (A) 142 - 424 K/uL   MPV 6.2 0 - 99.8 fL  POCT UA - Microscopic Only  Result Value Ref Range   WBC, Ur, HPF, POC 0-1    RBC, urine, microscopic 0-1    Bacteria, U Microscopic trace    Mucus, UA neg    Epithelial cells, urine per micros 1-3    Crystals, Ur, HPF, POC neg    Casts, Ur, LPF, POC neg    Yeast, UA neg   POCT urinalysis dipstick  Result Value Ref Range   Color, UA yellow    Clarity, UA clear    Glucose, UA neg    Bilirubin, UA neg    Ketones, UA trace    Spec Grav, UA 1.010    Blood, UA neg    pH, UA 5.5    Protein, UA trace    Urobilinogen, UA 0.2    Nitrite, UA neg    Leukocytes, UA Negative     [facility] reading (PRIMARY) by  Dr. Merla Richesoolittle. Chest x-ray shows right lower lobe infiltrate.       Assessment & Plan:  Fever--- secondary to otherwise asymptomatic pneumonia  Chronic atrial fibrillation--exacerbated by current illness  Leucocytosis -suggesting bacterial cause  Plan--Levaquin 500 daily for 7 days -Very close follow-up with recheck in 48 hours if not better -Repeat chest x-ray in 4 weeks to prove that this infiltrate clears and does not have an underlying obstructive cause  Note to Dr. Ladona Ridgelaylor  I have completed the patient encounter in its entirety as documented by the scribe, with editing by me where necessary. Robert P. Merla Richesoolittle, M.D.

## 2014-11-14 NOTE — Telephone Encounter (Signed)
F/u   Pt calling concerning previous messages.

## 2014-11-14 NOTE — Telephone Encounter (Signed)
New Message    Patient states that something is wrong with him b/c medication that he is using is not work (metoprolol). Patient is tired, didn't feel good , no energy and he has had fevers @ night. (low grade fever). Please give patient a call.

## 2014-11-14 NOTE — Telephone Encounter (Signed)
Started last week.(played racquetball until Wed) Then c/o no energy and he felt like the Metoprolol was not working.   He has low grade fever with chills at night like his body is fighting an infection. HR resting is 90-100.  He has not seen his PCP as he does not have one.  I have recommended he see someone and have labs drawn to determine what's going on.  Discussed with Dr Ladona Ridgelaylor and he agrees with plan.  Let the patient know to call back after seeing MD and determining what they feel could be the cause of his symptoms.

## 2014-11-15 LAB — COMPREHENSIVE METABOLIC PANEL
ALBUMIN: 3.5 g/dL (ref 3.5–5.2)
ALK PHOS: 65 U/L (ref 39–117)
ALT: 15 U/L (ref 0–53)
AST: 18 U/L (ref 0–37)
BUN: 13 mg/dL (ref 6–23)
CALCIUM: 8.8 mg/dL (ref 8.4–10.5)
CO2: 26 meq/L (ref 19–32)
CREATININE: 0.9 mg/dL (ref 0.50–1.35)
Chloride: 98 mEq/L (ref 96–112)
Glucose, Bld: 124 mg/dL — ABNORMAL HIGH (ref 70–99)
POTASSIUM: 4.3 meq/L (ref 3.5–5.3)
Sodium: 137 mEq/L (ref 135–145)
Total Bilirubin: 0.8 mg/dL (ref 0.2–1.2)
Total Protein: 6.7 g/dL (ref 6.0–8.3)

## 2014-11-17 ENCOUNTER — Ambulatory Visit (INDEPENDENT_AMBULATORY_CARE_PROVIDER_SITE_OTHER): Payer: Federal, State, Local not specified - PPO

## 2014-11-17 ENCOUNTER — Ambulatory Visit (INDEPENDENT_AMBULATORY_CARE_PROVIDER_SITE_OTHER): Payer: Federal, State, Local not specified - PPO | Admitting: Family Medicine

## 2014-11-17 ENCOUNTER — Telehealth: Payer: Self-pay

## 2014-11-17 VITALS — BP 130/82 | HR 86 | Temp 97.9°F | Resp 18 | Ht 70.0 in | Wt 211.0 lb

## 2014-11-17 DIAGNOSIS — R509 Fever, unspecified: Secondary | ICD-10-CM

## 2014-11-17 DIAGNOSIS — J189 Pneumonia, unspecified organism: Secondary | ICD-10-CM

## 2014-11-17 LAB — POCT CBC
Granulocyte percent: 79.7 %G (ref 37–80)
HCT, POC: 42.1 % — AB (ref 43.5–53.7)
Hemoglobin: 13.6 g/dL — AB (ref 14.1–18.1)
Lymph, poc: 2.4 (ref 0.6–3.4)
MCH, POC: 28.8 pg (ref 27–31.2)
MCHC: 32.2 g/dL (ref 31.8–35.4)
MCV: 89.2 fL (ref 80–97)
MID (CBC): 1.2 — AB (ref 0–0.9)
MPV: 6.2 fL (ref 0–99.8)
PLATELET COUNT, POC: 485 10*3/uL — AB (ref 142–424)
POC GRANULOCYTE: 4.72 (ref 2–6.9)
POC LYMPH PERCENT: 13.5 %L (ref 10–50)
POC MID %: 6.8 % (ref 0–12)
RBC: 4.72 M/uL (ref 4.69–6.13)
RDW, POC: 12.9 %
WBC: 17.9 10*3/uL — AB (ref 4.6–10.2)

## 2014-11-17 MED ORDER — DOXYCYCLINE HYCLATE 100 MG PO CAPS: 100.0000 mg | ORAL_CAPSULE | Freq: Two times a day (BID) | ORAL | Status: DC

## 2014-11-17 NOTE — Telephone Encounter (Signed)
Pt's daughter called to ask questions about the antibiotic that was prescribed to [name] on 11/14/14. He has been taking the antibiotic every day but still has a fever. He wants to know if this is normal or if he needs to RTC. Please call daughter- Gasper LloydRhiannon Monforte- she is on his hippa form. Please leave a detailed message if she does not answer.

## 2014-11-17 NOTE — Patient Instructions (Signed)
1. Return  Monday, 6/6 (4:00-8:30pm) to see Dr. Merla Riches.   2. Dr. Merla Riches is working Sunday, 8:30-4:00.   Fever, Adult A fever is a higher than normal body temperature. In an adult, an oral temperature around 98.6 F (37 C) is considered normal. A temperature of 100.4 F (38 C) or higher is generally considered a fever. Mild or moderate fevers generally have no long-term effects and often do not require treatment. Extreme fever (greater than or equal to 106 F or 41.1 C) can cause seizures. The sweating that may occur with repeated or prolonged fever may cause dehydration. Elderly people can develop confusion during a fever. A measured temperature can vary with:  Age.  Time of day.  Method of measurement (mouth, underarm, rectal, or ear). The fever is confirmed by taking a temperature with a thermometer. Temperatures can be taken different ways. Some methods are accurate and some are not.  An oral temperature is used most commonly. Electronic thermometers are fast and accurate.  An ear temperature will only be accurate if the thermometer is positioned as recommended by the manufacturer.  A rectal temperature is accurate and done for those adults who have a condition where an oral temperature cannot be taken.  An underarm (axillary) temperature is not accurate and not recommended. Fever is a symptom, not a disease.  CAUSES   Infections commonly cause fever.  Some noninfectious causes for fever include:  Some arthritis conditions.  Some thyroid or adrenal gland conditions.  Some immune system conditions.  Some types of cancer.  A medicine reaction.  High doses of certain street drugs such as methamphetamine.  Dehydration.  Exposure to high outside or room temperatures.  Occasionally, the source of a fever cannot be determined. This is sometimes called a "fever of unknown origin" (FUO).  Some situations may lead to a temporary rise in body temperature that may go  away on its own. Examples are:  Childbirth.  Surgery.  Intense exercise. HOME CARE INSTRUCTIONS   Take appropriate medicines for fever. Follow dosing instructions carefully. If you use acetaminophen to reduce the fever, be careful to avoid taking other medicines that also contain acetaminophen. Do not take aspirin for a fever if you are younger than age 40. There is an association with Reye's syndrome. Reye's syndrome is a rare but potentially deadly disease.  If an infection is present and antibiotics have been prescribed, take them as directed. Finish them even if you start to feel better.  Rest as needed.  Maintain an adequate fluid intake. To prevent dehydration during an illness with prolonged or recurrent fever, you may need to drink extra fluid.Drink enough fluids to keep your urine clear or pale yellow.  Sponging or bathing with room temperature water may help reduce body temperature. Do not use ice water or alcohol sponge baths.  Dress comfortably, but do not over-bundle. SEEK MEDICAL CARE IF:   You are unable to keep fluids down.  You develop vomiting or diarrhea.  You are not feeling at least partly better after 3 days.  You develop new symptoms or problems. SEEK IMMEDIATE MEDICAL CARE IF:   You have shortness of breath or trouble breathing.  You develop excessive weakness.  You are dizzy or you faint.  You are extremely thirsty or you are making little or no urine.  You develop new pain that was not there before (such as in the head, neck, chest, back, or abdomen).  You have persistent vomiting and diarrhea for more than  1 to 2 days.  You develop a stiff neck or your eyes become sensitive to light.  You develop a skin rash.  You have a fever or persistent symptoms for more than 2 to 3 days.  You have a fever and your symptoms suddenly get worse. MAKE SURE YOU:   Understand these instructions.  Will watch your condition.  Will get help right away if  you are not doing well or get worse. Document Released: 11/26/2000 Document Revised: 10/17/2013 Document Reviewed: 04/03/2011 [facility] Patient Information 2015 [employer] [place]. This information is not intended to replace advice given to you by your health care provider. Make sure you discuss any questions you have with your health care provider.

## 2014-11-17 NOTE — Telephone Encounter (Signed)
Spoke with pt, advised to RTC for eval. Pt states if he still has a fever tomorrow he will come in.

## 2014-11-17 NOTE — Progress Notes (Signed)
Subjective:    Patient ID: Charles Mills, male    DOB: Aug 05, 1955, 59 y.o.   MRN: [id number]  11/17/2014  Follow-up; Pneumonia; and Fever   HPI This 59 y.o. male presents for evaluation for persistent fever.  Minimal symptoms; +fatigue, fever, tachycardia.  Still suffering with persistent symptoms. Feeling a little bit better.  At noon, temperature 101.9; took two Tylenol.  Daily fever; goes up every six hours.  Has been waiting on Tylenol dose.  Onset of symptoms/fever 6-7 days.  No headache.  No ear pain, sore throat, rhinorrhea, nasal congestion, neck pain, cough.  Abdominal pain, n/v/d/c; no bloody stools.  No back pain.  No dysuria, urgency, hematuria, nocturia, rash.  No recent travel.  No recent sick contacts.  No tick bites; not outside much. Did have two insect bites at time of onset.  Retired.  Male partner.  With onset, fever mostly at night; fever getting more prolonged and frequent.    S/p CXR at last visit that showed mild RML infiltrate.     Review of Systems  Constitutional: Positive for fever, chills, diaphoresis and fatigue.  HENT: Negative for congestion, ear pain, postnasal drip, rhinorrhea and sinus pressure.   Respiratory: Negative for cough and shortness of breath.   Cardiovascular: Positive for palpitations. Negative for chest pain and leg swelling.  Gastrointestinal: Negative for nausea, vomiting, abdominal pain, diarrhea and abdominal distention.  Genitourinary: Negative for urgency, hematuria, flank pain, decreased urine volume, discharge, penile swelling, scrotal swelling, genital sores, penile pain and testicular pain.  Musculoskeletal: Negative for back pain and arthralgias.  Skin: Negative for rash.  Neurological: Negative for dizziness, light-headedness and headaches.    Past Medical History  Diagnosis Date  . HTN (hypertension)   . Atrial fibrillation     Echocardiogram 05/20/11: EF 55-65%, moderate LAE, mild RAE;  failed DCCV 06/2011;  Flecainided  started 07/2011;  DCCV 08/06/11  . Mild obstructive sleep apnea     saw Dr. Shelle Iron 3/13:  conservative mgmt for now  . HLD (hyperlipidemia)    Past Surgical History  Procedure Laterality Date  . Tonsillectomy    . Cardioversion  07/02/2011    Procedure: CARDIOVERSION;  Surgeon: Wendall Stade, MD;  Location[facility] OR;  Service: Cardiovascular;  Laterality: N/A;  . Cardioversion  08/06/2011    Procedure: CARDIOVERSION;  Surgeon: [name], MD;  Location: [facility] OR;  Service: Cardiovascular;  Laterality: N/A;   No Known Allergies  History   Social History  . Marital Status: Divorced    Spouse Name: N/A  . Number of Children: N/A  . Years of Education: N/A   Occupational History  . 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 Smoking status: Never Smoker   . Smokeless tobacco: Never Used  . Alcohol Use: No     Comment: occassionally  . Drug Use: No  . Sexual Activity: Not on file   Other Topics Concern  . Not on file   Social History Narrative   Divorced. Now single. Lives with [facility]. Dating seriously; duration 1.5 years.          Objective:    BP 130/82 mmHg  Pulse 86  Temp(Src) 97.9 F (36.6 C) (Oral)  Resp 18  Ht  (1.778 m)  Wt 211 lb (95.709 kg)  BMI 30.28 kg/m2  SpO2 96% Physical Exam  Constitutional: He is oriented to person, place, and time. He  appears well-developed and well-nourished.  Non-toxic appearance. He appears ill. No distress.  HENT:  Head: Normocephalic and atraumatic.  Right Ear: External ear normal.  Left Ear: External ear normal.  Nose: Nose normal.  Mouth/Throat: Oropharynx is clear and moist.  Eyes: Conjunctivae and EOM are normal. Pupils are equal, round, and reactive to light.  Neck: Normal range of motion. Neck supple. Carotid bruit is not present. No thyromegaly present.  Cardiovascular: Normal rate, regular rhythm, normal heart sounds and intact distal pulses.  Exam reveals no gallop  and no friction rub.   No murmur heard. Pulmonary/Chest: Effort normal and breath sounds normal. He has no wheezes. He has no rales.  Abdominal: Soft. Bowel sounds are normal. He exhibits no distension and no mass. There is no tenderness. There is no rebound and no guarding.  Lymphadenopathy:    He has no cervical adenopathy.  Neurological: He is alert and oriented to person, place, and time. No cranial nerve deficit.  Skin: Skin is warm and dry. No rash noted. He is not diaphoretic.  Psychiatric: He has a normal mood and affect. His behavior is normal.  Nursing note and vitals reviewed.  Results for orders placed or performed in visit on 11/17/14  Comprehensive metabolic panel  Result Value Ref Range   Sodium 134 (L) 135 - 145 mEq/L   Potassium 4.6 3.5 - 5.3 mEq/L   Chloride 98 96 - 112 mEq/L   CO2 27 19 - 32 mEq/L   Glucose, Bld 116 (H) 70 - 99 mg/dL   BUN 17 6 - 23 mg/dL   Creat 1.61 0.96 - 0.45 mg/dL   Total Bilirubin 0.5 0.2 - 1.2 mg/dL   Alkaline Phosphatase 81 39 - 117 U/L   AST 31 0 - 37 U/L   ALT 24 0 - 53 U/L   Total Protein 6.4 6.0 - 8.3 g/dL   Albumin 3.2 (L) 3.5 - 5.2 g/dL   Calcium 8.5 8.4 - 40.9 mg/dL  POCT CBC  Result Value Ref Range   WBC 17.9 (A) 4.6 - 10.2 K/uL   Lymph, poc 2.4 0.6 - 3.4   POC LYMPH PERCENT 13.5 10 - 50 %L   MID (cbc) 1.2 (A) 0 - 0.9   POC MID % 6.8 0 - 12 %M   POC Granulocyte 4.72 2 - 6.9   Granulocyte percent 79.7 37 - 80 %G   RBC 4.72 4.69 - 6.13 M/uL   Hemoglobin 13.6 (A) 14.1 - 18.1 g/dL   HCT, POC 81.1 (A) 91.4 - 53.7 %   MCV 89.2 80 - 97 fL   MCH, POC 28.8 27 - 31.2 pg   MCHC 32.2 31.8 - 35.4 g/dL   RDW, POC 78.2 %   Platelet Count, POC 485 (A) 142 - 424 K/uL   MPV 6.2 0 - 99.8 fL   [facility] reading (PRIMARY) by  Dr. Katrinka Blazing. CXR: persistent mild RML infiltrate      Assessment & Plan:   1. Other specified fever   2. CAP (community acquired pneumonia)    -Persistent fever despite Levaquin therapy. -Will add Doxycycline for  tick borne illness and to cover for atypical causes of pneumonia.   -RTC 72 hours for reevaluation by Dr. Merla Riches.  -RTC sooner for acute decline.   Meds ordered this encounter  Medications  . DISCONTD: doxycycline (VIBRAMYCIN) 100 MG capsule    Sig: Take 1 capsule (100 mg total) by mouth 2 (two) times daily.    Dispense:  20  capsule    Refill:  0    Return in about 3 days (around 11/20/2014).    Jaslyne Beeck Paulita FujitaMartin Shaya Altamura, M.D. [facility]  [place] [address] [place], [place]  [id number] [phone] phone [phone] fax

## 2014-11-17 NOTE — Telephone Encounter (Signed)
He should be getting better by now on the antibiotic. He needs to RTC for further evaluation.

## 2014-11-17 NOTE — Telephone Encounter (Signed)
Left message for pt to call back  °

## 2014-11-18 LAB — COMPREHENSIVE METABOLIC PANEL
ALBUMIN: 3.2 g/dL — AB (ref 3.5–5.2)
ALK PHOS: 81 U/L (ref 39–117)
ALT: 24 U/L (ref 0–53)
AST: 31 U/L (ref 0–37)
BUN: 17 mg/dL (ref 6–23)
CO2: 27 meq/L (ref 19–32)
Calcium: 8.5 mg/dL (ref 8.4–10.5)
Chloride: 98 mEq/L (ref 96–112)
Creat: 0.91 mg/dL (ref 0.50–1.35)
Glucose, Bld: 116 mg/dL — ABNORMAL HIGH (ref 70–99)
POTASSIUM: 4.6 meq/L (ref 3.5–5.3)
SODIUM: 134 meq/L — AB (ref 135–145)
TOTAL PROTEIN: 6.4 g/dL (ref 6.0–8.3)
Total Bilirubin: 0.5 mg/dL (ref 0.2–1.2)

## 2014-11-20 ENCOUNTER — Other Ambulatory Visit: Payer: Self-pay | Admitting: Internal Medicine

## 2014-11-20 ENCOUNTER — Ambulatory Visit (INDEPENDENT_AMBULATORY_CARE_PROVIDER_SITE_OTHER): Payer: Federal, State, Local not specified - PPO | Admitting: Internal Medicine

## 2014-11-20 VITALS — BP 132/78 | HR 85 | Temp 98.1°F | Resp 17 | Wt 210.0 lb

## 2014-11-20 DIAGNOSIS — R938 Abnormal findings on diagnostic imaging of other specified body structures: Secondary | ICD-10-CM | POA: Diagnosis not present

## 2014-11-20 DIAGNOSIS — R700 Elevated erythrocyte sedimentation rate: Secondary | ICD-10-CM | POA: Diagnosis not present

## 2014-11-20 DIAGNOSIS — R9389 Abnormal findings on diagnostic imaging of other specified body structures: Secondary | ICD-10-CM

## 2014-11-20 DIAGNOSIS — R509 Fever, unspecified: Secondary | ICD-10-CM | POA: Diagnosis not present

## 2014-11-20 DIAGNOSIS — D72829 Elevated white blood cell count, unspecified: Secondary | ICD-10-CM

## 2014-11-20 LAB — POCT CBC
GRANULOCYTE PERCENT: 78.5 % (ref 37–80)
HCT, POC: 40.2 % — AB (ref 43.5–53.7)
HEMOGLOBIN: 13.2 g/dL — AB (ref 14.1–18.1)
Lymph, poc: 3.3 (ref 0.6–3.4)
MCH, POC: 28.8 pg (ref 27–31.2)
MCHC: 32.8 g/dL (ref 31.8–35.4)
MCV: 87.9 fL (ref 80–97)
MID (CBC): 0.7 (ref 0–0.9)
MPV: 6.1 fL (ref 0–99.8)
PLATELET COUNT, POC: 656 10*3/uL — AB (ref 142–424)
POC GRANULOCYTE: 14.4 — AB (ref 2–6.9)
POC LYMPH PERCENT: 17.8 %L (ref 10–50)
POC MID %: 3.7 % (ref 0–12)
RBC: 4.57 M/uL — AB (ref 4.69–6.13)
RDW, POC: 12.7 %
WBC: 18.4 10*3/uL — AB (ref 4.6–10.2)

## 2014-11-20 LAB — POCT SEDIMENTATION RATE: POCT SED RATE: 110 mm/hr — AB (ref 0–22)

## 2014-11-20 NOTE — Progress Notes (Addendum)
Subjective:  This chart was scribed for Ellamae Siaobert Leiya Keesey, MD by Stann Oresung-Kai Tsai, Medical Scribe. This patient was seen in Room 4 and the patient's care was started at 6:15 PM.     Patient ID: Charles Mills, male    DOB: 07/23/1955, 59 y.o.   MRN: [id number]  HPI Charles Mills is a 59 y.o. male [facility] his fever that occurred a week ago. He saw Dr. Katrinka BlazingSmith 3 days ago and she added doxycycline. His last day of Levaquin is 6/7.  He states still having fever and fatigue, but it doesn't feel as bad. Today, he had a fever early this morning and has taken 2 tylenol. Was at 101.8 this afternoon. He also notes his appetite comes and goes.  He has no new symptoms to suggest a focus for this fever.   Patient Active Problem List   Diagnosis Date Noted  . Erectile dysfunction 08/18/2013  . OSA (obstructive sleep apnea) 07/25/2011  . Hyperlipidemia 05/21/2011  . Atrial fibrillation 05/19/2011  . Hypertension 05/19/2011  -----he has not had exacerbation of his atrial fibrillation during his fever  Current outpatient prescriptions:  .  aspirin EC 81 MG tablet, Take 1 tablet (81 mg total) by mouth daily., Disp: 150 tablet, Rfl: 2 .  Cholecalciferol (VITAMIN D) 2000 UNITS CAPS, Take 1 capsule by mouth 2 (two) times daily.  , Disp: , Rfl:  .  Coenzyme Q10 (CO Q 10) 100 MG CAPS, Take 100 mg by mouth every morning.  , Disp: , Rfl:  .  CRESTOR 40 MG tablet, TAKE 1 TABLET BY MOUTH EVERY DAY, Disp: 30 tablet, Rfl: 3 .  cyanocobalamin 500 MCG tablet, Take 500 mcg by mouth every morning.  , Disp: , Rfl:  .  doxycycline (VIBRAMYCIN) 100 MG capsule, Take 1 capsule (100 mg total) by mouth 2 (two) times daily., Disp: 20 capsule, Rfl: 0 .  levofloxacin (LEVAQUIN) 500 MG tablet, Take 1 tablet (500 mg total) by mouth daily., Disp: 7 tablet, Rfl: 0 .  metoprolol (LOPRESSOR) 50 MG tablet, Take 1 tablet by mouth in the morning and take 1.5 tablets by mouth in the evening, Disp: , Rfl:  .   nitroGLYCERIN (NITROSTAT) 0.4 MG SL tablet, Place 1 tablet (0.4 mg total) under the tongue every 5 (five) minutes as needed (MAX 3 TABLETS). For chest pain, Disp: 25 tablet, Rfl: 11 .  Omega-3 Fatty Acids (FISH OIL) 1200 MG CAPS, Take 1 capsule by mouth 2 (two) times daily. , Disp: , Rfl:  .  OVER THE COUNTER MEDICATION, Place 2 drops into both eyes every morning. Murine Eye Drops, Disp: , Rfl:  .  Probiotic Product (PROBIOTIC PO), Take 1 tablet by mouth every morning.  , Disp: , Rfl:  .  QUERCETIN PO, Take 500 mg by mouth every morning.  , Disp: , Rfl:  .  Saw Palmetto, Serenoa repens, (SAW PALMETTO BERRIES) 540 MG CAPS, Take 1 capsule by mouth 2 (two) times daily.  , Disp: , Rfl:  .  tadalafil (CIALIS) 20 MG tablet, Take 1 tablet (20 mg total) by mouth daily as needed for erectile dysfunction., Disp: 10 tablet, Rfl: 3 .  vitamin C (ASCORBIC ACID) 500 MG tablet, Take 500 mg by mouth 2 (two) times daily.  , Disp: , Rfl:  .  vitamin E 400 UNIT capsule, Take 400 Units by mouth 2 (two) times daily.  , Disp: , Rfl:  .  Calcium Carbonate (CALCIUM 600 PO),  Take 600 mg by mouth 2 (two) times daily.  , Disp: , Rfl:     Review of Systems  Constitutional: Positive for fever and fatigue.  Respiratory: Negative for cough and shortness of breath.        Objective:   Physical Exam BP 132/78 mmHg  Pulse 85  Temp(Src) 98.1 F (36.7 C) (Oral)  Resp 17  Wt 210 lb (95.255 kg)  SpO2 93%   HEENT clear without thyromegaly or cervical adenopathy--no supraclavicular adenopathy lungs clear   heart regular without murmur Abdomen soft nontender nondistended-no organomegaly Extremities clear  Results for orders placed or performed in visit on 11/20/14  POCT CBC  Result Value Ref Range   WBC 18.4 (A) 4.6 - 10.2 K/uL   Lymph, poc 3.3 0.6 - 3.4   POC LYMPH PERCENT 17.8 10 - 50 %L   MID (cbc) 0.7 0 - 0.9   POC MID % 3.7 0 - 12 %M   POC Granulocyte 14.4 (A) 2 - 6.9   Granulocyte percent 78.5 37 - 80 %G    RBC 4.57 (A) 4.69 - 6.13 M/uL   Hemoglobin 13.2 (A) 14.1 - 18.1 g/dL   HCT, POC 96.0 (A) 45.4 - 53.7 %   MCV 87.9 80 - 97 fL   MCH, POC 28.8 27 - 31.2 pg   MCHC 32.8 31.8 - 35.4 g/dL   RDW, POC 09.8 %   Platelet Count, POC 656 (A) 142 - 424 K/uL   MPV 6.1 0 - 99.8 fL  POCT SEDIMENTATION RATE  Result Value Ref Range   POCT SED RATE 110 (A) 0 - 22 mm/hr         Assessment & Plan:  Fever, unspecified fever cause  Abnormal chest x-ray - Plan: CT Chest W Contrast Leucocytosis Elevated sedimentation rate  - Plan:CT Chest W Contrast  -Continue antibiotics for now Underlying atrial fibrillation  I have completed the patient encounter in its entirety as documented by the scribe, with editing by me where necessary. Sparkle Aube P. Merla Riches, M.D.  Addend= Chest CT within normal limits regard to febrile illness We'll schedule infectious disease consult next Forward records to cardiology for consideration of echocardiogram to look at the valve structure

## 2014-11-21 ENCOUNTER — Other Ambulatory Visit: Payer: Federal, State, Local not specified - PPO

## 2014-11-21 ENCOUNTER — Telehealth: Payer: Self-pay

## 2014-11-21 ENCOUNTER — Encounter (HOSPITAL_BASED_OUTPATIENT_CLINIC_OR_DEPARTMENT_OTHER): Payer: Self-pay

## 2014-11-21 ENCOUNTER — Ambulatory Visit (HOSPITAL_BASED_OUTPATIENT_CLINIC_OR_DEPARTMENT_OTHER)
Admission: RE | Admit: 2014-11-21 | Discharge: 2014-11-21 | Disposition: A | Discharge status: Home / Self Care | Payer: Federal, State, Local not specified - PPO | Source: Ambulatory Visit | Attending: Internal Medicine | Admitting: Internal Medicine

## 2014-11-21 ENCOUNTER — Other Ambulatory Visit: Payer: Self-pay | Admitting: Internal Medicine

## 2014-11-21 DIAGNOSIS — R911 Solitary pulmonary nodule: Secondary | ICD-10-CM | POA: Diagnosis not present

## 2014-11-21 DIAGNOSIS — R9389 Abnormal findings on diagnostic imaging of other specified body structures: Secondary | ICD-10-CM

## 2014-11-21 DIAGNOSIS — R509 Fever, unspecified: Secondary | ICD-10-CM | POA: Insufficient documentation

## 2014-11-21 MED ORDER — IOHEXOL 300 MG/ML  SOLN
75.0000 mL | Freq: Once | INTRAMUSCULAR | Status: AC | PRN
  Administered 2014-11-21: 75 mL via INTRAVENOUS

## 2014-11-21 NOTE — Telephone Encounter (Signed)
Per note 3.15.16 

## 2014-11-21 NOTE — Telephone Encounter (Signed)
Patient is calling to get more information about his referral to get a ct scan. He was informed that it's currently in process. Please call patient! [phone]

## 2014-11-21 NOTE — Addendum Note (Signed)
Addended by: Tonye PearsonOLITTLE, Dorita Rowlands P on: 11/21/2014 07:42 PM   Modules accepted: Orders, Level of Service

## 2014-11-22 ENCOUNTER — Other Ambulatory Visit: Payer: Self-pay | Admitting: Internal Medicine

## 2014-11-22 ENCOUNTER — Telehealth: Payer: Self-pay

## 2014-11-22 NOTE — Telephone Encounter (Signed)
Atorvastatin filled yesterday.

## 2014-11-22 NOTE — Telephone Encounter (Signed)
Refill of Crestor given for one month. Patient has not had lipids checked in a long time. Left a message with Rx for him to call and schedule Lipid/Liver functions.

## 2014-11-23 ENCOUNTER — Ambulatory Visit (INDEPENDENT_AMBULATORY_CARE_PROVIDER_SITE_OTHER): Payer: Federal, State, Local not specified - PPO | Admitting: Internal Medicine

## 2014-11-23 ENCOUNTER — Other Ambulatory Visit: Payer: Self-pay | Admitting: Internal Medicine

## 2014-11-23 ENCOUNTER — Encounter: Payer: Self-pay | Admitting: Internal Medicine

## 2014-11-23 VITALS — BP 139/73 | HR 134 | Temp 99.1°F | Wt 210.0 lb

## 2014-11-23 DIAGNOSIS — R509 Fever, unspecified: Secondary | ICD-10-CM | POA: Diagnosis not present

## 2014-11-23 DIAGNOSIS — D72829 Elevated white blood cell count, unspecified: Secondary | ICD-10-CM | POA: Diagnosis not present

## 2014-11-23 NOTE — Telephone Encounter (Signed)
lmom for patient to return my call.  Just a follow up on how he is doing.

## 2014-11-23 NOTE — Progress Notes (Signed)
Subjective:    Patient ID: Charles Mills, male    DOB: March 29, 1956, 59 y.o.   MRN: [id number]  HPI Comments: Mr. Koegel is a 59 year old male with PMH as below here for two week history of unexplained fever.  He was seen at Urgent Care on 05/31 and work-up revealed an elevated WBC of 18.7.  He had no respiratory symptoms but there was question of a RLL infiltrate on xray and he was prescribed a 7 day course of Levaquin.  His fevers persisted despite Levaquin use and he returned to Urgent Care on 06/03.  His WBC was still elevated at 17.9 and Doxycycline was added (likely due to concern for RMSF).   Symptoms persisted despite doxycycline and levaquin so he returned to urgent care on 06/06.  CT was obtained but did not reveal a source of infection.  Given the persistent fevers and leukocytosis despite antibiotic therapy he was referred to ID for further work-up.  He reports daily fevers with a Tmax of 102F.  He is having night sweats, increased fatigue, palpitations.  He denies N/V, diarrhea, dysuria or joint pain.  He denies tobacco, drug or EtOH use.  He has no history of autoimmune disease, malignancy or unexplained fever.  He is retired form the [employer].  He has not been out of the country recently.  He often travels to [place], [place] for leisure and was last there two months ago.  He did not engage in any unusual activities or eat new foods.  He has no known sick contacts and no one around him has similar symptoms.  He does not spend much time out doors.  He works out five days per week.    He went to an acupuncturist four weeks ago and says he was given "[place] herbal medicine" to take on 05/06.  He took a spoonful and noticed his HR increased so has not taken any since then.   He otherwise felt fine and the new symptoms started around 05/27.    Past Medical History  Diagnosis Date  . HTN (hypertension)   . Atrial fibrillation     Echocardiogram 05/20/11: EF 55-65%, moderate LAE, mild RAE;  failed  DCCV 06/2011;  Flecainided started 07/2011;  DCCV 08/06/11  . Mild obstructive sleep apnea     saw Dr. Shelle Iron 3/13:  conservative mgmt for now  . HLD (hyperlipidemia)    Current Outpatient Prescriptions on File Prior to Visit  Medication Sig Dispense Refill  . aspirin EC 81 MG tablet Take 1 tablet (81 mg total) by mouth daily. 150 tablet 2  . Calcium Carbonate (CALCIUM 600 PO) Take 600 mg by mouth 2 (two) times daily.      . Cholecalciferol (VITAMIN D) 2000 UNITS CAPS Take 1 capsule by mouth 2 (two) times daily.      . Coenzyme Q10 (CO Q 10) 100 MG CAPS Take 100 mg by mouth every morning.      . cyanocobalamin 500 MCG tablet Take 500 mcg by mouth every morning.      Marland Kitchen doxycycline (VIBRAMYCIN) 100 MG capsule Take 1 capsule (100 mg total) by mouth 2 (two) times daily. 20 capsule 0  . metoprolol (LOPRESSOR) 50 MG tablet Take 1 tablet by mouth in the morning and take 1.5 tablets by mouth in the evening    . nitroGLYCERIN (NITROSTAT) 0.4 MG SL tablet Place 1 tablet (0.4 mg total) under the tongue every 5 (five) minutes as needed (MAX 3 TABLETS). For  chest pain 25 tablet 11  . Omega-3 Fatty Acids (FISH OIL) 1200 MG CAPS Take 1 capsule by mouth 2 (two) times daily.     Marland Kitchen OVER THE COUNTER MEDICATION Place 2 drops into both eyes every morning. Murine Eye Drops    . Probiotic Product (PROBIOTIC PO) Take 1 tablet by mouth every morning.      Marland Kitchen QUERCETIN PO Take 500 mg by mouth every morning.      . rosuvastatin (CRESTOR) 40 MG tablet TAKE 1 TABLET BY MOUTH EVERY DAY 30 tablet 0  . Saw Palmetto, Serenoa repens, (SAW PALMETTO BERRIES) 540 MG CAPS Take 1 capsule by mouth 2 (two) times daily.      . tadalafil (CIALIS) 20 MG tablet Take 1 tablet (20 mg total) by mouth daily as needed for erectile dysfunction. 10 tablet 3  . vitamin C (ASCORBIC ACID) 500 MG tablet Take 500 mg by mouth 2 (two) times daily.      . vitamin E 400 UNIT capsule Take 400 Units by mouth 2 (two) times daily.      Marland Kitchen levofloxacin  (LEVAQUIN) 500 MG tablet Take 1 tablet (500 mg total) by mouth daily. (Patient not taking: Reported on 11/23/2014) 7 tablet 0   No current facility-administered medications on file prior to visit.    Review of Systems  Constitutional: Positive for fever, chills, diaphoresis, appetite change and fatigue.  HENT: Negative for ear pain, rhinorrhea, sore throat and trouble swallowing.   Respiratory: Negative for cough and shortness of breath.   Cardiovascular: Negative for chest pain, palpitations and leg swelling.  Gastrointestinal: Negative for nausea, vomiting, abdominal pain, diarrhea, constipation and blood in stool.  Genitourinary: Negative for dysuria, hematuria and difficulty urinating.  Musculoskeletal: Negative for arthralgias.  Skin: Positive for rash.       Filed Vitals:   11/23/14 1431  BP: 139/73  Pulse: 134  Temp: 99.1 F (37.3 C)  TempSrc: Oral  Weight: 210 lb (95.255 kg)   Objective:   Physical Exam  Constitutional: He is oriented to person, place, and time. He appears well-developed. No distress.  HENT:  Head: Normocephalic and atraumatic.  Mouth/Throat: Oropharynx is clear and moist. No oropharyngeal exudate.  Eyes: Conjunctivae and EOM are normal. Pupils are equal, round, and reactive to light.  Neck: Neck supple. No thyromegaly present.  Cardiovascular: Normal rate and normal heart sounds.  Exam reveals no gallop and no friction rub.   No murmur heard. Irregular regular  Pulmonary/Chest: Effort normal. No respiratory distress. He has no wheezes. He has no rales.  Abdominal: Soft. Bowel sounds are normal. He exhibits no distension and no mass. There is no tenderness. There is no rebound and no guarding.  Musculoskeletal: Normal range of motion. He exhibits no edema or tenderness.  Lymphadenopathy:    He has no cervical adenopathy.  Neurological: He is alert and oriented to person, place, and time. No cranial nerve deficit.  Skin: Skin is warm. Rash noted. He is  diaphoretic.  Red macular rash on B/L buttocks.  Psychiatric: He has a normal mood and affect. His behavior is normal. Judgment and thought content normal.  Vitals reviewed.         Assessment & Plan:   FUO:  The patient has a two week history of unexplained fever and leukocytosis.  Given his lack of respiratory symptoms, CT findings and lack of response to antibiotic it is unlikely that this is pneumonia.  He reports insect bites to his neck and one  on his arm that have since resolved.  He does not spend much time outdoors, denies bullseye rash and has not had response to doxycycline so RMSF and other tick-borne illnesses are less likely.  He has noticed the rash on his buttock before (prior to onset of symptoms) and it appears to be heat related.  There are no stigmata of IE.  The differential remains broad including infection, autoimmune disease, malignancy (less likely). - labs today:  CBC with diff, blood cultures x 2, CMV IgM/IgG, HIV ab  - will check 2D ECHO to evaluate for IE - stop doxycycline since symptoms are not responding - will see him back on 11/27/14 to reassess and follow-up labs   Addendum:  I have seen and examined Mr. Mediate after reviewing his records. He has chronic atrial fibrillation but otherwise has been in relatively good health recently until he developed a small area of rash on his anterior neck and right upper arm about 2-1/2 weeks ago. Those areas resolved within a few days but he began developing daily fevers associated with mild chills and drenching sweats. He has become increasingly fatigued. He's been evaluated on 3 occasions by his primary care team. He was noted to have leukocytosis with a predominance of granulocytes. His initial chest x-ray suggested a faint right lower lobe infiltrate. He was started on levofloxacin on 11/14/2014 and took it for 1 week. He did not notice any improvement. He was seen again on 11/17/2014 and 11/20/2014. He received empiric  doxycycline and again noted no improvement in his fevers or fatigue. A follow-up chest CT scan did not confirm any pneumonia and in retrospect I doubt that that is the source of his fever. He has not had any unusual travel history, exposure to other known sick contacts or animal exposure.  He has a chronic, unchanged rash on his right buttocks. He is pale and diaphoretic today but otherwise his exam is unrevealing. This is playing out as a true fever of unknown origin. I will have him stop doxycycline since he's not had any evidence of response to any bacterial agents. I will check blood cultures, repeat blood work including CMV IgM antibody and HIV antibody, and a transthoracic echocardiogram. I've given him my business card and asked him to call me over the weekend if he is getting worse. I will see him back early next week for further evaluation.

## 2014-11-24 ENCOUNTER — Telehealth: Payer: Self-pay | Admitting: *Deleted

## 2014-11-24 ENCOUNTER — Other Ambulatory Visit: Payer: Self-pay | Admitting: Internal Medicine

## 2014-11-24 DIAGNOSIS — R509 Fever, unspecified: Secondary | ICD-10-CM

## 2014-11-24 LAB — CBC WITH DIFFERENTIAL/PLATELET
BASOS ABS: 0 10*3/uL (ref 0.0–0.1)
Basophils Relative: 0 % (ref 0–1)
EOS ABS: 0.2 10*3/uL (ref 0.0–0.7)
Eosinophils Relative: 1 % (ref 0–5)
HCT: 35.3 % — ABNORMAL LOW (ref 39.0–52.0)
HEMOGLOBIN: 11.7 g/dL — AB (ref 13.0–17.0)
Lymphocytes Relative: 13 % (ref 12–46)
Lymphs Abs: 2.1 10*3/uL (ref 0.7–4.0)
MCH: 29 pg (ref 26.0–34.0)
MCHC: 33.1 g/dL (ref 30.0–36.0)
MCV: 87.4 fL (ref 78.0–100.0)
MPV: 8.1 fL — AB (ref 8.6–12.4)
Monocytes Absolute: 2.1 10*3/uL — ABNORMAL HIGH (ref 0.1–1.0)
Monocytes Relative: 13 % — ABNORMAL HIGH (ref 3–12)
Neutro Abs: 11.5 10*3/uL — ABNORMAL HIGH (ref 1.7–7.7)
Neutrophils Relative %: 73 % (ref 43–77)
Platelets: 542 10*3/uL — ABNORMAL HIGH (ref 150–400)
RBC: 4.04 MIL/uL — AB (ref 4.22–5.81)
RDW: 13.4 % (ref 11.5–15.5)
WBC: 15.8 10*3/uL — ABNORMAL HIGH (ref 4.0–10.5)

## 2014-11-24 LAB — CYTOMEGALOVIRUS ANTIBODY, IGG: CYTOMEGALOVIRUS AB-IGG: 1 U/mL — AB (ref <0.60)

## 2014-11-24 LAB — HIV ANTIBODY (ROUTINE TESTING W REFLEX): HIV 1&2 Ab, 4th Generation: NONREACTIVE

## 2014-11-24 LAB — COMPREHENSIVE METABOLIC PANEL
ALT: 32 U/L (ref 0–53)
AST: 28 U/L (ref 0–37)
Albumin: 2.8 g/dL — ABNORMAL LOW (ref 3.5–5.2)
Alkaline Phosphatase: 85 U/L (ref 39–117)
BILIRUBIN TOTAL: 0.4 mg/dL (ref 0.2–1.2)
BUN: 10 mg/dL (ref 6–23)
CO2: 23 mEq/L (ref 19–32)
Calcium: 8.1 mg/dL — ABNORMAL LOW (ref 8.4–10.5)
Chloride: 102 mEq/L (ref 96–112)
Creat: 0.67 mg/dL (ref 0.50–1.35)
Glucose, Bld: 118 mg/dL — ABNORMAL HIGH (ref 70–99)
POTASSIUM: 4.4 meq/L (ref 3.5–5.3)
Sodium: 136 mEq/L (ref 135–145)
Total Protein: 6 g/dL (ref 6.0–8.3)

## 2014-11-24 LAB — CMV IGM: CMV IgM: <8 AU/mL (ref <30.00)

## 2014-11-24 NOTE — Telephone Encounter (Signed)
Called the patient to give the appt information 12/01/14 at 930 am at [facility]. Advised the patient to arrive by 915 to register. The patient also wanted to let the doctor know that he remembered that before this happened he did eat shrimp the night before the symptoms started.

## 2014-11-27 ENCOUNTER — Ambulatory Visit (INDEPENDENT_AMBULATORY_CARE_PROVIDER_SITE_OTHER): Payer: Federal, State, Local not specified - PPO | Admitting: Internal Medicine

## 2014-11-27 VITALS — BP 143/93 | HR 144 | Temp 98.9°F | Ht 70.0 in | Wt 208.5 lb

## 2014-11-27 DIAGNOSIS — R509 Fever, unspecified: Secondary | ICD-10-CM

## 2014-11-27 DIAGNOSIS — D649 Anemia, unspecified: Secondary | ICD-10-CM | POA: Insufficient documentation

## 2014-11-27 LAB — EPSTEIN-BARR VIRUS VCA ANTIBODY PANEL
EBV EA IgG: <5 U/mL (ref <9.0)
EBV VCA IgG: >750 U/mL — ABNORMAL HIGH (ref <18.0)

## 2014-11-27 NOTE — Progress Notes (Signed)
Patient ID: Charles Mills, male   DOB: [birth year], 59 y.o.   MRN: [id number]         [facility]  Patient Active Problem List   Diagnosis Date Noted  . Fever of unknown origin 11/23/2014    Priority: High  . Leukocytosis 11/23/2014    Priority: High  . Normocytic anemia 11/27/2014  . Erectile dysfunction 08/18/2013  . OSA (obstructive sleep apnea) 07/25/2011  . Hyperlipidemia 05/21/2011  . Atrial fibrillation 05/19/2011  . Hypertension 05/19/2011    Patient's Medications  New Prescriptions   No medications on file  Previous Medications   ASPIRIN EC 81 MG TABLET    Take 1 tablet (81 mg total) by mouth daily.   CALCIUM CARBONATE (CALCIUM 600 PO)    Take 600 mg by mouth 2 (two) times daily.     CHOLECALCIFEROL (VITAMIN D) 2000 UNITS CAPS    Take 1 capsule by mouth 2 (two) times daily.     COENZYME Q10 (CO Q 10) 100 MG CAPS    Take 100 mg by mouth every morning.     CYANOCOBALAMIN 500 MCG TABLET    Take 500 mcg by mouth every morning.     METOPROLOL (LOPRESSOR) 50 MG TABLET    Take 1 tablet by mouth in the morning and take 1.5 tablets by mouth in the evening   NITROGLYCERIN (NITROSTAT) 0.4 MG SL TABLET    Place 1 tablet (0.4 mg total) under the tongue every 5 (five) minutes as needed (MAX 3 TABLETS). For chest pain   OMEGA-3 FATTY ACIDS (FISH OIL) 1200 MG CAPS    Take 1 capsule by mouth 2 (two) times daily.    OVER THE COUNTER MEDICATION    Place 2 drops into both eyes every morning. Murine Eye Drops   PROBIOTIC PRODUCT (PROBIOTIC PO)    Take 1 tablet by mouth every morning.     QUERCETIN PO    Take 500 mg by mouth every morning.     ROSUVASTATIN (CRESTOR) 40 MG TABLET    TAKE 1 TABLET BY MOUTH EVERY DAY   SAW PALMETTO, SERENOA REPENS, (SAW PALMETTO BERRIES) 540 MG CAPS    Take 1 capsule by mouth 2 (two) times daily.     TADALAFIL (CIALIS) 20 MG TABLET    Take 1 tablet (20 mg total) by mouth daily as needed for erectile dysfunction.   VITAMIN C (ASCORBIC  ACID) 500 MG TABLET    Take 500 mg by mouth 2 (two) times daily.     VITAMIN E 400 UNIT CAPSULE    Take 400 Units by mouth 2 (two) times daily.    Modified Medications   No medications on file  Discontinued Medications   DOXYCYCLINE (VIBRAMYCIN) 100 MG CAPSULE    Take 1 capsule (100 mg total) by mouth 2 (two) times daily.   LEVOFLOXACIN (LEVAQUIN) 500 MG TABLET    Take 1 tablet (500 mg total) by mouth daily.    Subjective: Charles Mills is in for his routine follow-up for his fever of unknown origin. 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 He stays active playing racquetball and working in his garden. He has had no unusual travel or animal exposure recently. He has continued to have daily fevers, chills and sweats since his initial visit last week. He has been taking Tylenol every 6-8 hours to try to keep his fever down. He remains extremely fatigued. His appetite remains good. He has been  trying to limit his food intake so that he would not gain weight while he is unable to exercise. He has been bothered by tachycardia. He wears a fit bit and notes that his normal resting heart rate is 60-90 but has been over 100 since his fevers began several weeks ago.  Review of Systems: Constitutional: positive for chills, fatigue, fevers and sweats, negative for anorexia and weight loss Eyes: negative Ears, nose, mouth, throat, and face: negative Respiratory: negative Cardiovascular: positive for tachycardia with his baseline atrial fibrillation, negative for chest pain, chest pressure/discomfort, dyspnea, orthopnea, paroxysmal nocturnal dyspnea and syncope Gastrointestinal: negative for abdominal pain, constipation, diarrhea, nausea, odynophagia, reflux symptoms and vomiting Genitourinary:negative for dysuria and hematuria Musculoskeletal:negative for arthralgias, myalgias and stiff joints  Past Medical History  Diagnosis Date  . HTN (hypertension)   . Atrial fibrillation      Echocardiogram 05/20/11: EF 55-65%, moderate LAE, mild RAE;  failed DCCV 06/2011;  Flecainided started 07/2011;  DCCV 08/06/11  . Mild obstructive sleep apnea     saw Dr. Shelle Iron 3/13:  conservative mgmt for now  . HLD (hyperlipidemia)     History  Substance Use Topics  . Smoking status: Never Smoker   . Smokeless tobacco: Never Used  . Alcohol Use: No     Comment: occassionally    Family History  Problem Relation Age of Onset  . Cancer - Lung Mother   . Raynaud syndrome Mother   . Hypertension Brother   . Hyperlipidemia Brother   . Diabetes Maternal Grandmother   . Cancer Father     No Known Allergies  Objective: Temp: 98.9 F (37.2 C) (06/13 1339) Temp Source: Oral (06/13 1339) BP: 143/93 mmHg (06/13 1339) Pulse Rate: 144 (06/13 1339)  General: His weight remains stable at 208.5 pounds.  Skin: He is very pale. No change in chronic rash on buttock  Lymph nodes: No palpable adenopathy  Eyes: Normal external exam  Temporal and facial arteries normal to palpation  Oral: Coated tongue  Lungs: Clear  Cor: Irregularly irregular S1 and S2 with tachycardia. No murmur heard  Abdomen: Soft and nontender. I cannot palpate a liver, spleen or other masses Joints and extremities: Normal   Lab Results Lab Results  Component Value Date   WBC 15.8* 11/23/2014   HGB 11.7* 11/23/2014   HCT 35.3* 11/23/2014   MCV 87.4 11/23/2014   PLT 542* 11/23/2014   CMP     Component Value Date/Time   NA 136 11/23/2014 1631   K 4.4 11/23/2014 1631   CL 102 11/23/2014 1631   CO2 23 11/23/2014 1631   GLUCOSE 118* 11/23/2014 1631   BUN 10 11/23/2014 1631   CREATININE 0.67 11/23/2014 1631   CREATININE 1.1 07/30/2011 1005   CALCIUM 8.1* 11/23/2014 1631   PROT 6.0 11/23/2014 1631   ALBUMIN 2.8* 11/23/2014 1631   AST 28 11/23/2014 1631   ALT 32 11/23/2014 1631   ALKPHOS 85 11/23/2014 1631   BILITOT 0.4 11/23/2014 1631   GFRNONAA 77* 05/20/2011 0547   GFRAA 89* 05/20/2011 0547   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 He continues to have some mild leukocytosis and thrombocytosis. He is developing a mild normocytic anemia. I talked to him at length about the frustrating nature of FUO and the standard evaluation. I will continue observation off of anabiotic's for now, repeat blood cultures, do further blood  work looking for clues as to possible infections, autoimmune disorders and malignancy. I will also check a CT scan of the abdomen and pelvis. His fever has triggered loss of rate control with his atrial fibrillation. He is scheduled for echocardiogram on 12/01/2014.  Plan: Observe off of anabiotic's Repeat blood cultures QuantiFERON TB gold assay, HIV viral load, LDH, CK, ANA, rheumatoid factor, serum protein electrophoresis, CT scan of abdomen and pelvis with contrast Echocardiogram Follow-up in 2 weeks (I will call him with initial results of these tests on 11/30/2014)  Cliffton Asters, MD [facility] [phone] pager   [phone] cell 11/27/2014, 2:48 PM

## 2014-11-28 LAB — CK: CK TOTAL: 44 U/L (ref 7–232)

## 2014-11-28 LAB — HIV-1 RNA QUANT-NO REFLEX-BLD

## 2014-11-28 LAB — LACTATE DEHYDROGENASE: LDH: 172 U/L (ref 94–250)

## 2014-11-28 LAB — ANA: ANA: NEGATIVE

## 2014-11-28 LAB — RHEUMATOID FACTOR

## 2014-11-29 LAB — PROTEIN ELECTROPHORESIS, SERUM, WITH REFLEX
ALPHA-1-GLOBULIN: 0.8 g/dL — AB (ref 0.2–0.3)
ALPHA-2-GLOBULIN: 1.3 g/dL — AB (ref 0.5–0.9)
Albumin ELP: 2.2 g/dL — ABNORMAL LOW (ref 3.8–4.8)
BETA 2: 0.4 g/dL (ref 0.2–0.5)
BETA GLOBULIN: 0.4 g/dL (ref 0.4–0.6)
GAMMA GLOBULIN: 1.1 g/dL (ref 0.8–1.7)
TOTAL PROTEIN, SERUM ELECTROPHOR: 6.3 g/dL (ref 6.1–8.1)

## 2014-11-29 LAB — CULTURE, BLOOD (SINGLE)
Organism ID, Bacteria: NO GROWTH
Organism ID, Bacteria: NO GROWTH

## 2014-11-30 ENCOUNTER — Telehealth: Payer: Self-pay | Admitting: Internal Medicine

## 2014-11-30 LAB — QUANTIFERON TB GOLD ASSAY (BLOOD)
MITOGEN VALUE: 0.14 [IU]/mL
Quantiferon Nil Value: 0.03 IU/mL
Quantiferon Tb Ag Minus Nil Value: 0 IU/mL
TB AG VALUE: 0.03 [IU]/mL

## 2014-11-30 NOTE — Telephone Encounter (Signed)
I spoke with Charles Mills by phone this morning. He has had unexplained fever of unknown origin for the past 3 weeks that was unresponsive to initial courses of levofloxacin and doxycycline. He has had mild leukocytosis and thrombocytosis and has recently developed mild normocytic anemia. All tests done so far have been negative or non-diagnostic. He has had negative blood cultures, normal LDH, normal CK, negative ANA, negative rheumatoid factor negative HIV viral load, negative UA and a nonspecific sedimentation rate of 110. His serum protein electropheresis showed nonspecific inflammatory patterns. His CMV and EBV serologies revealed remote, inactive infection. Chest CT scan showed only a tiny left lower lobe nodule. Complete metabolic panel showed only hyperglycemia. His liver enzymes have been normal. He has had no rash, palpable adenopathy or other abnormalities on exam to suggest a source. He has chronic atrial fibrillation and since he has had fevers he has experienced palpitations and tachycardia.  Of note, however, he states that he has felt a little bit better over the past 48 hours. His fevers do not appear to be coming as often. He continues to take Tylenol as soon as he starts to feel feverish. His maximum temperature in the past 48 hours is 100.4. He also feels like his appetite has improved.  Pending studies include Quantiferon gold TB assay, echocardiogram and CT scan of the abdomen and pelvis. I will be out of town from 12/01/2014 to 12/11/2014. I have asked my partner, Dr. Daiva Eves, to check on these studies and follow-up with him by phone.

## 2014-12-01 ENCOUNTER — Ambulatory Visit (HOSPITAL_COMMUNITY): Payer: Federal, State, Local not specified - PPO | Attending: Cardiovascular Disease

## 2014-12-01 ENCOUNTER — Other Ambulatory Visit: Payer: Self-pay

## 2014-12-01 DIAGNOSIS — I517 Cardiomegaly: Secondary | ICD-10-CM | POA: Diagnosis not present

## 2014-12-01 DIAGNOSIS — I313 Pericardial effusion (noninflammatory): Secondary | ICD-10-CM | POA: Insufficient documentation

## 2014-12-01 DIAGNOSIS — R509 Fever, unspecified: Secondary | ICD-10-CM

## 2014-12-03 ENCOUNTER — Telehealth: Payer: Self-pay | Admitting: Infectious Disease

## 2014-12-03 LAB — CULTURE, BLOOD (SINGLE)
ORGANISM ID, BACTERIA: NO GROWTH
Organism ID, Bacteria: NO GROWTH

## 2014-12-03 NOTE — Telephone Encounter (Signed)
Called and left message for pt to call me back.  Was trying to see how he is doing.   Not any new diagnostic tests so far

## 2014-12-05 ENCOUNTER — Telehealth: Payer: Self-pay | Admitting: *Deleted

## 2014-12-05 ENCOUNTER — Telehealth: Payer: Self-pay | Admitting: Infectious Disease

## 2014-12-05 NOTE — Telephone Encounter (Signed)
Excellent

## 2014-12-05 NOTE — Telephone Encounter (Signed)
Charles Mills, looks like this was ordered on 11/27/14. Can you please check on this.

## 2014-12-05 NOTE — Telephone Encounter (Signed)
Per Dr. Blair Dolphin request I checked up on this pt today.  Fevers are not coming back at night but still there in afternoon  He has not had CT abdomen done. Does anyone know if this is being held up due to prior approval?

## 2014-12-05 NOTE — Telephone Encounter (Signed)
Pt informed about CT appt, Fri., June 24 @ 1:40 PM, [address].  Pt to pick up contrast on Thurs., June 23 at that same location.  Pt verbalized understanding.

## 2014-12-07 ENCOUNTER — Telehealth: Payer: Self-pay | Admitting: *Deleted

## 2014-12-07 NOTE — Telephone Encounter (Signed)
Pt called to share that he has a new symptom - "knots" showed up on rib cage/left arm.  He wanted to report this just in case these were associated with his current condition.

## 2014-12-08 ENCOUNTER — Ambulatory Visit
Admission: RE | Admit: 2014-12-08 | Discharge: 2014-12-08 | Disposition: A | Discharge status: Home / Self Care | Payer: Federal, State, Local not specified - PPO | Source: Ambulatory Visit | Attending: Internal Medicine | Admitting: Internal Medicine

## 2014-12-08 DIAGNOSIS — R509 Fever, unspecified: Secondary | ICD-10-CM

## 2014-12-08 MED ORDER — IOPAMIDOL (ISOVUE-300) INJECTION 61%
100.0000 mL | Freq: Once | INTRAVENOUS | Status: AC | PRN
  Administered 2014-12-08: 100 mL via INTRAVENOUS

## 2014-12-10 NOTE — Telephone Encounter (Signed)
At this point best to have him come in and have Dr. Cathie Olden take a look at these new findings. CT scans and lab work otherwise unrevealing for Infectious cause of FUO

## 2014-12-11 ENCOUNTER — Telehealth: Payer: Self-pay | Admitting: Internal Medicine

## 2014-12-11 NOTE — Telephone Encounter (Signed)
Spoke with patient and his resting HR is running between 85-130.  He has a FUO and has been following with ID.  Has had mulitple test as well as labs.  He had a echo of which I have pulled for Dr Ladona Ridgel to review.  He is going to increase his Metoprolol to 75 mg twice daily from 50mg  /75 mg in hopes that it will help his resting HR

## 2014-12-11 NOTE — Telephone Encounter (Signed)
I spoke to [name] by phone today. He is still having intermittent fevers but is feeling a little bit better. His fevers are not as frequent or predictable. He started back going to the gym. He walked 30 minutes on the treadmill at 1.9 miles per hour. He is still bothered by tachycardia with his resting heart rate anywhere from 90 to 115. His repeat blood cultures were negative. His echocardiogram did not reveal any cause for fever. His CT of the abdomen and pelvis also did not reveal any cause for fever. I suggested that he contact his cardiologist, Dr. Sharrell KuGreg Taylor regarding his tachycardia. He will follow-up with me in 2 days for continued evaluation of his FUO.

## 2014-12-11 NOTE — Telephone Encounter (Signed)
New Message  Pt calling to speak w/ Rn about current condition- c/o infection. Please call back and discuss.

## 2014-12-12 NOTE — Telephone Encounter (Signed)
Dr Ladona Ridgelaylor reviewed echo and patient aware of results.  He will keep in contact.

## 2014-12-12 NOTE — Telephone Encounter (Signed)
Appt w/ Dr. Orvan Falconerampbell for 12/13/14 @ 4:15.

## 2014-12-13 ENCOUNTER — Ambulatory Visit (INDEPENDENT_AMBULATORY_CARE_PROVIDER_SITE_OTHER): Payer: Federal, State, Local not specified - PPO | Admitting: Internal Medicine

## 2014-12-13 ENCOUNTER — Encounter: Payer: Self-pay | Admitting: Internal Medicine

## 2014-12-13 VITALS — BP 147/88 | HR 142 | Temp 98.2°F | Wt 199.5 lb

## 2014-12-13 DIAGNOSIS — R634 Abnormal weight loss: Secondary | ICD-10-CM | POA: Diagnosis not present

## 2014-12-13 DIAGNOSIS — R509 Fever, unspecified: Secondary | ICD-10-CM | POA: Diagnosis not present

## 2014-12-13 DIAGNOSIS — L989 Disorder of the skin and subcutaneous tissue, unspecified: Secondary | ICD-10-CM

## 2014-12-13 LAB — COMPREHENSIVE METABOLIC PANEL
ALBUMIN: 3.1 g/dL — AB (ref 3.5–5.2)
ALK PHOS: 60 U/L (ref 39–117)
ALT: 17 U/L (ref 0–53)
AST: 15 U/L (ref 0–37)
BUN: 12 mg/dL (ref 6–23)
CO2: 26 meq/L (ref 19–32)
Calcium: 8.7 mg/dL (ref 8.4–10.5)
Chloride: 98 mEq/L (ref 96–112)
Creat: 0.78 mg/dL (ref 0.50–1.35)
Glucose, Bld: 109 mg/dL — ABNORMAL HIGH (ref 70–99)
Potassium: 4.9 mEq/L (ref 3.5–5.3)
SODIUM: 139 meq/L (ref 135–145)
Total Bilirubin: 0.4 mg/dL (ref 0.2–1.2)
Total Protein: 6.9 g/dL (ref 6.0–8.3)

## 2014-12-13 LAB — LACTATE DEHYDROGENASE: LDH: 130 U/L (ref 94–250)

## 2014-12-13 LAB — CK: Total CK: 51 U/L (ref 7–232)

## 2014-12-13 LAB — C-REACTIVE PROTEIN: CRP: 12.6 mg/dL — AB (ref <0.60)

## 2014-12-14 DIAGNOSIS — L989 Disorder of the skin and subcutaneous tissue, unspecified: Secondary | ICD-10-CM | POA: Insufficient documentation

## 2014-12-14 DIAGNOSIS — R634 Abnormal weight loss: Secondary | ICD-10-CM | POA: Insufficient documentation

## 2014-12-14 LAB — CBC WITH DIFFERENTIAL/PLATELET
BASOS ABS: 0 10*3/uL (ref 0.0–0.1)
BASOS PCT: 0 % (ref 0–1)
Eosinophils Absolute: 0.1 10*3/uL (ref 0.0–0.7)
Eosinophils Relative: 1 % (ref 0–5)
HCT: 33.8 % — ABNORMAL LOW (ref 39.0–52.0)
Hemoglobin: 11.1 g/dL — ABNORMAL LOW (ref 13.0–17.0)
Lymphocytes Relative: 22 % (ref 12–46)
Lymphs Abs: 3.1 10*3/uL (ref 0.7–4.0)
MCH: 28.4 pg (ref 26.0–34.0)
MCHC: 32.8 g/dL (ref 30.0–36.0)
MCV: 86.4 fL (ref 78.0–100.0)
MONO ABS: 1.7 10*3/uL — AB (ref 0.1–1.0)
MONOS PCT: 12 % (ref 3–12)
MPV: 8.1 fL — ABNORMAL LOW (ref 8.6–12.4)
Neutro Abs: 9.2 10*3/uL — ABNORMAL HIGH (ref 1.7–7.7)
Neutrophils Relative %: 65 % (ref 43–77)
Platelets: 593 10*3/uL — ABNORMAL HIGH (ref 150–400)
RBC: 3.91 MIL/uL — ABNORMAL LOW (ref 4.22–5.81)
RDW: 13.2 % (ref 11.5–15.5)
WBC: 14.1 10*3/uL — ABNORMAL HIGH (ref 4.0–10.5)

## 2014-12-14 LAB — SEDIMENTATION RATE: Sed Rate: 90 mm/hr — ABNORMAL HIGH (ref 0–20)

## 2014-12-14 LAB — LYME AB/WESTERN BLOT REFLEX: B burgdorferi Ab IgG+IgM: 0.33 {ISR}

## 2014-12-14 NOTE — Addendum Note (Signed)
Addended by: Jennet MaduroESTRIDGE, Val Schiavo D on: 12/14/2014 09:32 AM   Modules accepted: Orders

## 2014-12-14 NOTE — Progress Notes (Signed)
Patient ID: Charles Mills, male   DOB: 08/09/1955, 59 y.o.   MRN: [id number]         [facility]  Patient Active Problem List   Diagnosis Date Noted  . Unintentional weight loss 12/14/2014    Priority: High  . Skin lesion of left arm 12/14/2014    Priority: High  . Normocytic anemia 11/27/2014    Priority: High  . Fever of unknown origin 11/23/2014    Priority: High  . Leukocytosis 11/23/2014    Priority: High  . Erectile dysfunction 08/18/2013  . OSA (obstructive sleep apnea) 07/25/2011  . Hyperlipidemia 05/21/2011  . Atrial fibrillation 05/19/2011  . Hypertension 05/19/2011    Patient's Medications  New Prescriptions   No medications on file  Previous Medications   ASPIRIN EC 81 MG TABLET    Take 1 tablet (81 mg total) by mouth daily.   CALCIUM CARBONATE (CALCIUM 600 PO)    Take 600 mg by mouth 2 (two) times daily.     CHOLECALCIFEROL (VITAMIN D) 2000 UNITS CAPS    Take 1 capsule by mouth 2 (two) times daily.     COENZYME Q10 (CO Q 10) 100 MG CAPS    Take 100 mg by mouth every morning.     CYANOCOBALAMIN 500 MCG TABLET    Take 500 mcg by mouth every morning.     METOPROLOL (LOPRESSOR) 50 MG TABLET    Take 1 tablet by mouth in the morning and take 1.5 tablets by mouth in the evening   NITROGLYCERIN (NITROSTAT) 0.4 MG SL TABLET    Place 1 tablet (0.4 mg total) under the tongue every 5 (five) minutes as needed (MAX 3 TABLETS). For chest pain   OMEGA-3 FATTY ACIDS (FISH OIL) 1200 MG CAPS    Take 1 capsule by mouth 2 (two) times daily.    OVER THE COUNTER MEDICATION    Place 2 drops into both eyes every morning. Murine Eye Drops   PROBIOTIC PRODUCT (PROBIOTIC PO)    Take 1 tablet by mouth every morning.     QUERCETIN PO    Take 500 mg by mouth every morning.     ROSUVASTATIN (CRESTOR) 40 MG TABLET    TAKE 1 TABLET BY MOUTH EVERY DAY   SAW PALMETTO, SERENOA REPENS, (SAW PALMETTO BERRIES) 540 MG CAPS    Take 1 capsule by mouth 2 (two) times daily.     TADALAFIL (CIALIS) 20 MG TABLET    Take 1 tablet (20 mg total) by mouth daily as needed for erectile dysfunction.   VITAMIN C (ASCORBIC ACID) 500 MG TABLET    Take 500 mg by mouth 2 (two) times daily.     VITAMIN E 400 UNIT CAPSULE    Take 400 Units by mouth 2 (two) times daily.    Modified Medications   No medications on file  Discontinued Medications   No medications on file    Subjective: [name] is in for his routine followup. Overall he has been feeling a little bit better over the past 2 weeks. His fevers are not coming as frequently and did not appear to be going quite as high. He continues to take Tylenol when he notes fever going up. The highest temperature he has recorded has been 100 recently. The sweats are not as severe. He has not had any shaking chills. He has been able to start walking on his treadmill at home. His friends have noted that he seems to be  feeling better.  Although his appetite is good he has lost weight. He believes he has been able to regain 1 pound recently. Last week he developed 2 skin lesions one on his left anterior chest and one on his left arm. They were raised skin lesions. There is no significant pain, redness or itching. He's never had any skin lesions like this before. The lesion on his chest resolved spontaneously. A left arm lesion has gotten slightly larger. He is also noted some aching in his forearms recently.  He continues to be bothered by tachycardia. His cardiologist, Dr. Ladona Ridgel, increased his dose of metoprolol yesterday. His heart rate was down slightly yesterday but up again today.  Review of Systems: Constitutional: positive for fevers, malaise, sweats and weight loss, negative for anorexia and chills Eyes: negative Ears, nose, mouth, throat, and face: negative Respiratory: negative Cardiovascular: positive for palpitations and tachycardia Gastrointestinal: negative for abdominal pain, diarrhea, nausea and  vomiting Genitourinary:negative  Past Medical History  Diagnosis Date  . HTN (hypertension)   . Atrial fibrillation     Echocardiogram 05/20/11: EF 55-65%, moderate LAE, mild RAE;  failed DCCV 06/2011;  Flecainided started 07/2011;  DCCV 08/06/11  . Mild obstructive sleep apnea     saw Dr. Shelle Iron 3/13:  conservative mgmt for now  . HLD (hyperlipidemia)     History  Substance Use Topics  . Smoking status: Never Smoker   . Smokeless tobacco: Never Used  . Alcohol Use: No     Comment: occassionally    Family History  Problem Relation Age of Onset  . Cancer - Lung Mother   . Raynaud syndrome Mother   . Hypertension Brother   . Hyperlipidemia Brother   . Diabetes Maternal Grandmother   . Cancer Father     No Known Allergies  Objective:    General: his weight is down 13 pounds in the past month to 199.5. Skin: he is quite pale. He has a silver dollar sized circular skin lesion on his left upper arm. The lesion is raised and indurated. It is nontender. There is no cellulitis or fluctuance. Oral: No oropharyngeal lesions Eyes: Normal external exam Temporal and facial arteries: Normal to palpation Lungs: clear Cor: tachycardic with irregular S1 and S2 and no murmurs Abdomen: soft and nontender with no palpable masses Joints and extremities: No swelling or tenderness on palpation of his arms. Joints are normal.  Lab Results Lab Results  Component Value Date   WBC 14.1* 12/13/2014   HGB 11.1* 12/13/2014   HCT 33.8* 12/13/2014   MCV 86.4 12/13/2014   PLT 593* 12/13/2014   CMP     Component Value Date/Time   NA 139 12/13/2014 1723   K 4.9 12/13/2014 1723   CL 98 12/13/2014 1723   CO2 26 12/13/2014 1723   GLUCOSE 109* 12/13/2014 1723   BUN 12 12/13/2014 1723   CREATININE 0.78 12/13/2014 1723   CREATININE 1.1 07/30/2011 1005   CALCIUM 8.7 12/13/2014 1723   PROT 6.9 12/13/2014 1723   ALBUMIN 3.1* 12/13/2014 1723   AST 15 12/13/2014 1723   ALT 17 12/13/2014 1723    ALKPHOS 60 12/13/2014 1723   BILITOT 0.4 12/13/2014 1723   GFRNONAA 77* 05/20/2011 0547   GFRAA 89* 05/20/2011 0547   SED RATE (mm/hr)  Date Value  12/13/2014 90*   CRP (mg/dL)  Date Value  16/03/9603 12.6*   EBV and CMV serologies revealed remote, inactive infection HIV antibody and viral load negative Blood cultures negative x4  SPEP showed nonspecific pattern Rheumatoid factor and ANA negative CK and LDH normal QuantiFERON gold TB assay indeterminate Transthoracic echocardiogram showed no evidence of endocarditis or other acute abnormalities CT scan of chest, abdomen and pelvis showed only an incidental 4-5 mm left lower lobe nodule   Assessment: There is still no obvious cause for his acute illness. It appears that his fevers may be abating. I asked him to hold back on regular use of Tylenol to see if the fevers are actually going away. His new skin lesion is nonspecific but may be a clue as to what is causing this illness. I will see if we can arrange dermatology evaluation and possible biopsy. I will repeat some basic lab work today and follow him clinically. He appears comfortable with that plan.  Plan: 1. Continue observation off of antibiotics 2. Observe temperature off of Tylenol if possible 3. Dermatology evaluation 4. Repeat CK and LDH 5. Lyme serology ( I did discuss with him the importance of interpreting tests such as this and that 10 abnormal result those are not always indicate a firm diagnosis) 6. Followup in 2-3 weeks   Cliffton Asters, MD [place] [facility] [phone] pager   [phone] cell 12/14/2014, 8:40 AM

## 2014-12-14 NOTE — Progress Notes (Signed)
Referral to Dr. [facility]charlene [facility]'s Dermatology practice.  Appt made with [facility] for Tues., December 19, 2014 at 1:30 PM.  Pt informed of appt.

## 2014-12-20 ENCOUNTER — Telehealth: Payer: Self-pay | Admitting: Internal Medicine

## 2014-12-20 ENCOUNTER — Other Ambulatory Visit: Payer: Self-pay | Admitting: Internal Medicine

## 2014-12-20 NOTE — Telephone Encounter (Signed)
I spoke with [name] by phone this morning. He believes he is continuing to improve. His fever has been coming less frequently over the past week. He has not had any documented fever in the past 36 hours. His appetite has improved and he is having less aching in his forearms. His left arm skin lesion has significantly decreased in size. He was seen yesterday by [facility] (dermatology) who felt that the lesion was too small to biopsy. I reviewed his recent blood work. He does have some normocytic anemia and continued elevation of his inflammatory markers but a serum CK and LDH were normal and, as expected, his Lyme antibody was negative. I suggested continued observation off of antibiotics and that he attempt to resume usual activities. He has a followup appointment with me on 01/03/2015.

## 2014-12-20 NOTE — Telephone Encounter (Signed)
Thanks so much-I was worried I was missing something

## 2014-12-21 ENCOUNTER — Other Ambulatory Visit: Payer: Self-pay

## 2014-12-21 ENCOUNTER — Other Ambulatory Visit: Payer: Self-pay | Admitting: Internal Medicine

## 2014-12-21 MED ORDER — ROSUVASTATIN CALCIUM 40 MG PO TABS: 40.0000 mg | ORAL_TABLET | Freq: Every day | ORAL | Status: DC

## 2015-01-03 ENCOUNTER — Encounter: Payer: Self-pay | Admitting: Internal Medicine

## 2015-01-03 ENCOUNTER — Ambulatory Visit (INDEPENDENT_AMBULATORY_CARE_PROVIDER_SITE_OTHER): Payer: Federal, State, Local not specified - PPO | Admitting: Internal Medicine

## 2015-01-03 VITALS — Temp 98.3°F | Wt 194.2 lb

## 2015-01-03 DIAGNOSIS — R509 Fever, unspecified: Secondary | ICD-10-CM | POA: Diagnosis not present

## 2015-01-03 NOTE — Progress Notes (Signed)
Patient ID: Charles Mills, male   DOB: 06/22/1955, 59 y.o.   MRN: [id number]         [facility]  Patient Active Problem List   Diagnosis Date Noted  . Unintentional weight loss 12/14/2014    Priority: High  . Skin lesion of left arm 12/14/2014    Priority: High  . Normocytic anemia 11/27/2014    Priority: High  . Fever of unknown origin 11/23/2014    Priority: High  . Leukocytosis 11/23/2014    Priority: High  . Erectile dysfunction 08/18/2013  . OSA (obstructive sleep apnea) 07/25/2011  . Hyperlipidemia 05/21/2011  . Atrial fibrillation 05/19/2011  . Hypertension 05/19/2011    Patient's Medications  New Prescriptions   No medications on file  Previous Medications   ASPIRIN EC 81 MG TABLET    Take 1 tablet (81 mg total) by mouth daily.   CALCIUM CARBONATE (CALCIUM 600 PO)    Take 600 mg by mouth 2 (two) times daily.     CHOLECALCIFEROL (VITAMIN D) 2000 UNITS CAPS    Take 1 capsule by mouth 2 (two) times daily.     COENZYME Q10 (CO Q 10) 100 MG CAPS    Take 100 mg by mouth every morning.     CYANOCOBALAMIN 500 MCG TABLET    Take 500 mcg by mouth every morning.     METOPROLOL (LOPRESSOR) 50 MG TABLET    75 mg 2 (two) times daily.    NITROGLYCERIN (NITROSTAT) 0.4 MG SL TABLET    Place 1 tablet (0.4 mg total) under the tongue every 5 (five) minutes as needed (MAX 3 TABLETS). For chest pain   OMEGA-3 FATTY ACIDS (FISH OIL) 1200 MG CAPS    Take 1 capsule by mouth 2 (two) times daily.    OVER THE COUNTER MEDICATION    Place 2 drops into both eyes every morning. Murine Eye Drops   PROBIOTIC PRODUCT (PROBIOTIC PO)    Take 1 tablet by mouth every morning.     QUERCETIN PO    Take 500 mg by mouth every morning.     ROSUVASTATIN (CRESTOR) 40 MG TABLET    Take 1 tablet (40 mg total) by mouth daily.   SAW PALMETTO, SERENOA REPENS, (SAW PALMETTO BERRIES) 540 MG CAPS    Take 1 capsule by mouth 2 (two) times daily.     TADALAFIL (CIALIS) 20 MG TABLET    Take 1  tablet (20 mg total) by mouth daily as needed for erectile dysfunction.   VITAMIN C (ASCORBIC ACID) 500 MG TABLET    Take 500 mg by mouth 2 (two) times daily.     VITAMIN E 400 UNIT CAPSULE    Take 400 Units by mouth 2 (two) times daily.    Modified Medications   No medications on file  Discontinued Medications   No medications on file    Subjective: Charles Mills is in for routine follow-up for his fever of unknown origin. Fortunately he has not had any fever since July 4 and the severe aching in his arms has resolved. He has stopped taking Tylenol and ibuprofen. He's had no recurrence of his skin rash. His appetite has improved and he believes he started regaining the weight he has lost. He is gradually increasing his activity level. Review of Systems: Constitutional: negative for anorexia, chills, fevers and sweats  Past Medical History  Diagnosis Date  . HTN (hypertension)   . Atrial fibrillation     Echocardiogram  05/20/11: EF 55-65%, moderate LAE, mild RAE;  failed DCCV 06/2011;  Flecainided started 07/2011;  DCCV 08/06/11  . Mild obstructive sleep apnea     saw Dr. Shelle Iron 3/13:  conservative mgmt for now  . HLD (hyperlipidemia)     History  Substance Use Topics  . Smoking status: Never Smoker   . Smokeless tobacco: Never Used  . Alcohol Use: No     Comment: occassionally    Family History  Problem Relation Age of Onset  . Cancer - Lung Mother   . Raynaud syndrome Mother   . Hypertension Brother   . Hyperlipidemia Brother   . Diabetes Maternal Grandmother   . Cancer Father     No Known Allergies  Objective: Temp: 98.3 F (36.8 C) (07/20 1532) Temp Source: Oral (07/20 1532)  General: His weight is down 5 more pounds to 194-1/2 but he believes that he has actually gained some weight recently Skin: Is still pale but he has no rash Lungs: Clear Cor: Regular S1 and S2 no murmurs  Lab Results EBV and CMV serologies revealed remote, inactive infection HIV antibody and  viral load negative Blood cultures negative x4 SPEP showed nonspecific pattern Rheumatoid factor and ANA negative CK and LDH normal QuantiFERON gold TB assay indeterminate Lyme antibody negative Transthoracic echocardiogram showed no evidence of endocarditis or other acute abnormalities CT scan of chest, abdomen and pelvis showed only an incidental 4-5 mm left lower lobe nodule   Assessment: I'm quite hopeful that his fever of unknown origin and is spontaneously resolving.  Plan: 1. Gradually resume normal activities 2. Follow-up in 6 weeks   Cliffton Asters, MD [facility] for Infectious Disease [facility] [phone] pager   [phone] cell 01/03/2015, 4:11 PM

## 2015-01-19 ENCOUNTER — Other Ambulatory Visit: Payer: Self-pay | Admitting: Internal Medicine

## 2015-01-19 ENCOUNTER — Other Ambulatory Visit: Payer: Self-pay | Admitting: *Deleted

## 2015-01-19 MED ORDER — ROSUVASTATIN CALCIUM 40 MG PO TABS: 40.0000 mg | ORAL_TABLET | Freq: Every day | ORAL | Status: DC

## 2015-01-24 ENCOUNTER — Telehealth: Payer: Self-pay

## 2015-01-24 ENCOUNTER — Other Ambulatory Visit: Payer: Self-pay

## 2015-01-24 ENCOUNTER — Telehealth: Payer: Self-pay | Admitting: *Deleted

## 2015-01-24 ENCOUNTER — Other Ambulatory Visit: Payer: Self-pay | Admitting: Internal Medicine

## 2015-01-24 DIAGNOSIS — Z79899 Other long term (current) drug therapy: Secondary | ICD-10-CM

## 2015-01-24 DIAGNOSIS — E785 Hyperlipidemia, unspecified: Secondary | ICD-10-CM

## 2015-01-24 NOTE — Telephone Encounter (Signed)
Patient called and stated that he was at the pharmacy and they never received the rx for crestor. I made him aware that he needs lab work to check his cholesterol level. I sent him one thirty day rx and he agreed to come in for labs. Could you put the orders in for this? Thanks, MI

## 2015-01-24 NOTE — Telephone Encounter (Signed)
Looks as if order in already and apt for 8/16

## 2015-01-24 NOTE — Telephone Encounter (Signed)
Patient calls to schedule labs for Lipid, liver functions. Ordered in Epic and transferred to schedulers.

## 2015-01-30 ENCOUNTER — Other Ambulatory Visit (INDEPENDENT_AMBULATORY_CARE_PROVIDER_SITE_OTHER): Payer: Federal, State, Local not specified - PPO | Admitting: *Deleted

## 2015-01-30 DIAGNOSIS — E785 Hyperlipidemia, unspecified: Secondary | ICD-10-CM | POA: Diagnosis not present

## 2015-01-30 DIAGNOSIS — Z79899 Other long term (current) drug therapy: Secondary | ICD-10-CM | POA: Diagnosis not present

## 2015-01-30 LAB — HEPATIC FUNCTION PANEL
ALBUMIN: 4.1 g/dL (ref 3.5–5.2)
ALK PHOS: 44 U/L (ref 39–117)
ALT: 20 U/L (ref 0–53)
AST: 22 U/L (ref 0–37)
Bilirubin, Direct: 0.2 mg/dL (ref 0.0–0.3)
Total Bilirubin: 0.8 mg/dL (ref 0.2–1.2)
Total Protein: 7.4 g/dL (ref 6.0–8.3)

## 2015-01-30 LAB — LIPID PANEL
Cholesterol: 121 mg/dL (ref 0–200)
HDL: 44.9 mg/dL (ref >39.00)
LDL CALC: 61 mg/dL (ref 0–99)
NonHDL: 76.26
TRIGLYCERIDES: 78 mg/dL (ref 0.0–149.0)
Total CHOL/HDL Ratio: 3
VLDL: 15.6 mg/dL (ref 0.0–40.0)

## 2015-02-15 ENCOUNTER — Encounter: Payer: Self-pay | Admitting: Internal Medicine

## 2015-02-15 ENCOUNTER — Ambulatory Visit (INDEPENDENT_AMBULATORY_CARE_PROVIDER_SITE_OTHER): Payer: Federal, State, Local not specified - PPO | Admitting: Internal Medicine

## 2015-02-15 DIAGNOSIS — R509 Fever, unspecified: Secondary | ICD-10-CM

## 2015-02-15 LAB — CBC
HEMATOCRIT: 40.9 % (ref 39.0–52.0)
Hemoglobin: 13.3 g/dL (ref 13.0–17.0)
MCH: 28.3 pg (ref 26.0–34.0)
MCHC: 32.5 g/dL (ref 30.0–36.0)
MCV: 87 fL (ref 78.0–100.0)
MPV: 8.7 fL (ref 8.6–12.4)
PLATELETS: 225 10*3/uL (ref 150–400)
RBC: 4.7 MIL/uL (ref 4.22–5.81)
RDW: 16.8 % — AB (ref 11.5–15.5)
WBC: 6.7 10*3/uL (ref 4.0–10.5)

## 2015-02-15 LAB — COMPREHENSIVE METABOLIC PANEL
ALT: 22 U/L (ref 9–46)
AST: 25 U/L (ref 10–35)
Albumin: 4.3 g/dL (ref 3.6–5.1)
Alkaline Phosphatase: 44 U/L (ref 40–115)
BUN: 12 mg/dL (ref 7–25)
CALCIUM: 9 mg/dL (ref 8.6–10.3)
CHLORIDE: 106 mmol/L (ref 98–110)
CO2: 26 mmol/L (ref 20–31)
Creat: 0.91 mg/dL (ref 0.70–1.33)
GLUCOSE: 88 mg/dL (ref 65–99)
Potassium: 5.1 mmol/L (ref 3.5–5.3)
Sodium: 139 mmol/L (ref 135–146)
Total Bilirubin: 0.7 mg/dL (ref 0.2–1.2)
Total Protein: 7 g/dL (ref 6.1–8.1)

## 2015-02-15 LAB — C-REACTIVE PROTEIN: CRP: <0.5 mg/dL (ref <0.60)

## 2015-02-15 NOTE — Progress Notes (Signed)
Patient ID: Charles Mills, male   DOB: 1956/01/08, 59 y.o.   MRN: [id number]         [facility] for [facility]ious Disease  Patient Active Problem List   Diagnosis Date Noted  . Unintentional weight loss 12/14/2014    Priority: High  . Skin lesion of left arm 12/14/2014    Priority: High  . Normocytic anemia 11/27/2014    Priority: High  . Fever of unknown origin 11/23/2014    Priority: High  . Leukocytosis 11/23/2014    Priority: High  . Erectile dysfunction 08/18/2013  . OSA (obstructive sleep apnea) 07/25/2011  . Hyperlipidemia 05/21/2011  . Atrial fibrillation 05/19/2011  . Hypertension 05/19/2011    Patient's Medications  New Prescriptions   No medications on file  Previous Medications   ASPIRIN EC 81 MG TABLET    Take 1 tablet (81 mg total) by mouth daily.   CALCIUM CARBONATE (CALCIUM 600 PO)    Take 600 mg by mouth 2 (two) times daily.     CHOLECALCIFEROL (VITAMIN D) 2000 UNITS CAPS    Take 1 capsule by mouth 2 (two) times daily.     COENZYME Q10 (CO Q 10) 100 MG CAPS    Take 100 mg by mouth every morning.     CYANOCOBALAMIN 500 MCG TABLET    Take 500 mcg by mouth every morning.     METOPROLOL (LOPRESSOR) 50 MG TABLET    75 mg 2 (two) times daily.    NITROGLYCERIN (NITROSTAT) 0.4 MG SL TABLET    Place 1 tablet (0.4 mg total) under the tongue every 5 (five) minutes as needed (MAX 3 TABLETS). For chest pain   OMEGA-3 FATTY ACIDS (FISH OIL) 1200 MG CAPS    Take 1 capsule by mouth 2 (two) times daily.    OVER THE COUNTER MEDICATION    Place 2 drops into both eyes every morning. Murine Eye Drops   PROBIOTIC PRODUCT (PROBIOTIC PO)    Take 1 tablet by mouth every morning.     QUERCETIN PO    Take 500 mg by mouth every morning.     ROSUVASTATIN (CRESTOR) 40 MG TABLET    Take 1 tablet (40 mg total) by mouth daily.   ROSUVASTATIN (CRESTOR) 40 MG TABLET    TAKE 1 TABLET BY MOUTH EVERY DAY   SAW PALMETTO, SERENOA REPENS, (SAW PALMETTO BERRIES) 540 MG CAPS    Take 1 capsule by  mouth 2 (two) times daily.     TADALAFIL (CIALIS) 20 MG TABLET    Take 1 tablet (20 mg total) by mouth daily as needed for erectile dysfunction.   VITAMIN C (ASCORBIC ACID) 500 MG TABLET    Take 500 mg by mouth 2 (two) times daily.     VITAMIN E 400 UNIT CAPSULE    Take 400 Units by mouth 2 (two) times daily.    Modified Medications   No medications on file  Discontinued Medications   No medications on file    Subjective: He is feeling dramatically better. He has not had any further fever, chills or sweats in the past month. His appetite has returned to normal and he has regained most of the weight he has lost. He has returned to the gym 5-6 days each week. He is now walking on the treadmill for an hour and he has played racquetball for 6 days and anaerobic. He is also lifting weights and feels like his stamina is almost back to normal.  Review of  Systems: Constitutional: negative Eyes: negative Ears, nose, mouth, throat, and face: negative Respiratory: negative Cardiovascular: negative, his tachycardia has resolved Gastrointestinal: negative Genitourinary:negative  Past Medical History  Diagnosis Date  . HTN (hypertension)   . Atrial fibrillation     Echocardiogram 05/20/11: EF 55-65%, moderate LAE, mild RAE;  failed DCCV 06/2011;  Flecainided started 07/2011;  DCCV 08/06/11  . Mild obstructive sleep apnea     saw Dr. Shelle Iron 3/13:  conservative mgmt for now  . HLD (hyperlipidemia)     Social History  Substance Use Topics  . Smoking status: Never Smoker   . Smokeless tobacco: Never Used  . Alcohol Use: No     Comment: occassionally    Family History  Problem Relation Age of Onset  . Cancer - Lung Mother   . Raynaud syndrome Mother   . Hypertension Brother   . Hyperlipidemia Brother   . Diabetes Maternal Grandmother   . Cancer Father     No Known Allergies  Objective: Filed Vitals:   02/15/15 1122  BP: 143/82  Pulse: 87  Temp: 98 F (36.7 C)  TempSrc: Oral    Weight: 207 lb 4 oz (94.008 kg)   Body mass index is 29.74 kg/(m^2).  General: His color is better Skin: No rash Lymph nodes: No palpable adenopathy Oral: No oropharyngeal lesions Lungs: Clear Cor: Regular S1 and S2 with no murmurs Abdomen: Soft and nontender with no palpable masses Extremities: Normal    Problem List Items Addressed This Visit      High   Fever of unknown origin    There is no known cause for his recent fever of unknown origin and fortunately he is improving spontaneously. I encouraged him to continue to resume all usual activities. I will repeat blood work today to see that his leukocytosis, anemia and elevated inflammatory markers are returning to normal. He will follow-up here as needed.      Relevant Orders   CBC   Comprehensive metabolic panel   C-reactive protein   Sedimentation rate       Cliffton Asters, MD [facility] for Infectious Disease [facility] [phone] pager   [phone] cell 02/15/2015, 11:40 AM

## 2015-02-15 NOTE — Assessment & Plan Note (Signed)
There is no known cause for his recent fever of unknown origin and fortunately he is improving spontaneously. I encouraged him to continue to resume all usual activities. I will repeat blood work today to see that his leukocytosis, anemia and elevated inflammatory markers are returning to normal. He will follow-up here as needed.

## 2015-02-16 ENCOUNTER — Other Ambulatory Visit: Payer: Self-pay | Admitting: Internal Medicine

## 2015-02-16 LAB — SEDIMENTATION RATE: Sed Rate: 4 mm/h (ref 0–20)

## 2015-02-20 ENCOUNTER — Other Ambulatory Visit: Payer: Self-pay

## 2015-02-20 ENCOUNTER — Telehealth: Payer: Self-pay

## 2015-02-20 MED ORDER — ROSUVASTATIN CALCIUM 40 MG PO TABS: 40.0000 mg | ORAL_TABLET | Freq: Every day | ORAL | Status: DC

## 2015-02-20 NOTE — Telephone Encounter (Signed)
Pt called needing his refill of crestor. I called [facility] [place] and they said they never got approved refill request. Pt came for lab work already. I sent in refill of crestor and pt said he will pick up in two days.

## 2015-02-20 NOTE — Telephone Encounter (Signed)
Charles Maw, MD at 08/29/2014 5:36 PM  CRESTOR 40 MG tabletTAKE 1 TABLET BY MOUTH EVERY DAY   Doralee Albino Reiland, LPN at 1/61/0960 3:26 PM     Status: Signed       Expand All Collapse All   Patient calls to schedule labs for Lipid, liver functions. Ordered in Epic and transferred to schedulers.          Notes Recorded by Deliah Boston, RN on 02/06/2015 at 11:52 AM Informed patient of results/KLanier,RN    Pharmacy said never received approved refill request

## 2015-03-28 ENCOUNTER — Encounter: Payer: Self-pay | Admitting: Internal Medicine

## 2015-04-10 ENCOUNTER — Other Ambulatory Visit: Payer: Self-pay | Admitting: Internal Medicine

## 2015-04-20 ENCOUNTER — Other Ambulatory Visit: Payer: Self-pay | Admitting: Internal Medicine

## 2015-07-12 ENCOUNTER — Other Ambulatory Visit: Payer: Self-pay | Admitting: Internal Medicine

## 2015-09-09 IMAGING — CR DG CHEST 2V
2 series · 2 of 2 positions shown · non-contrast
Comparison: 11/14/2014

CLINICAL DATA: Followup pneumonia. Persistent fever, fatigue, and
tachycardia.

EXAM:
CHEST  2 VIEW

[PA]
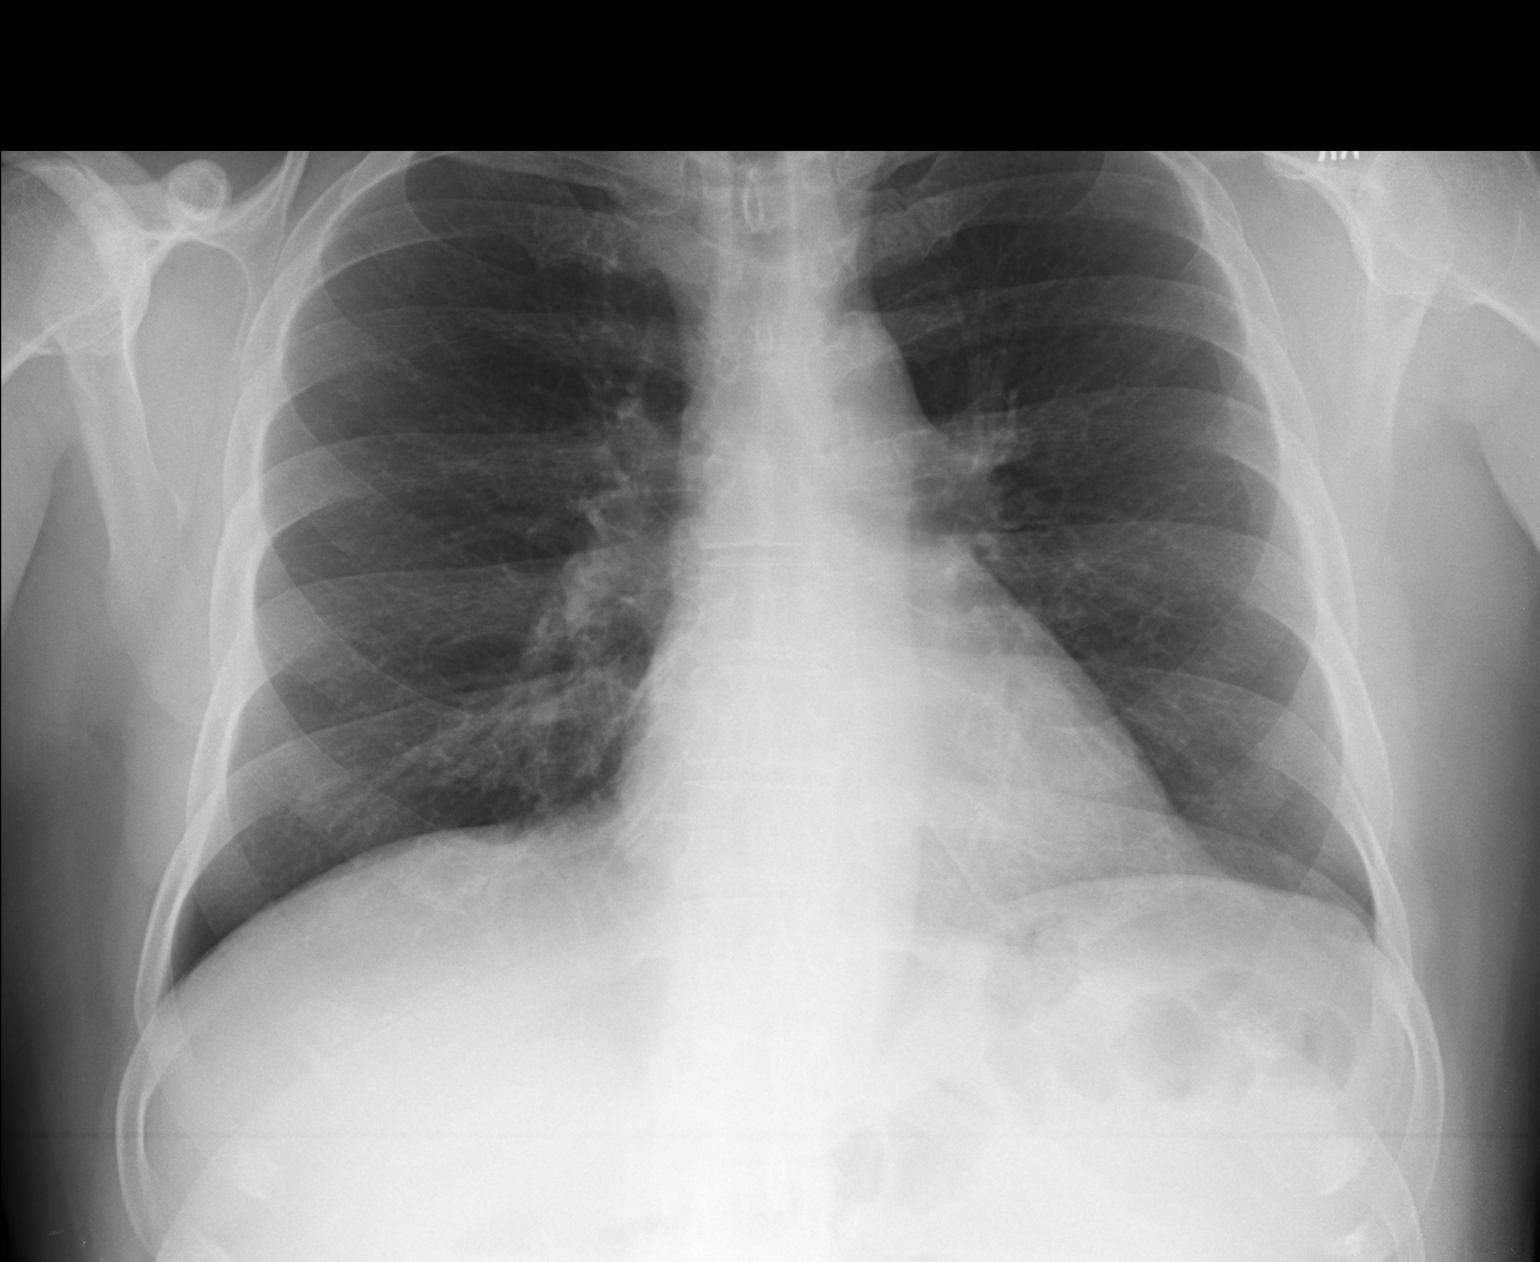

[lateral]
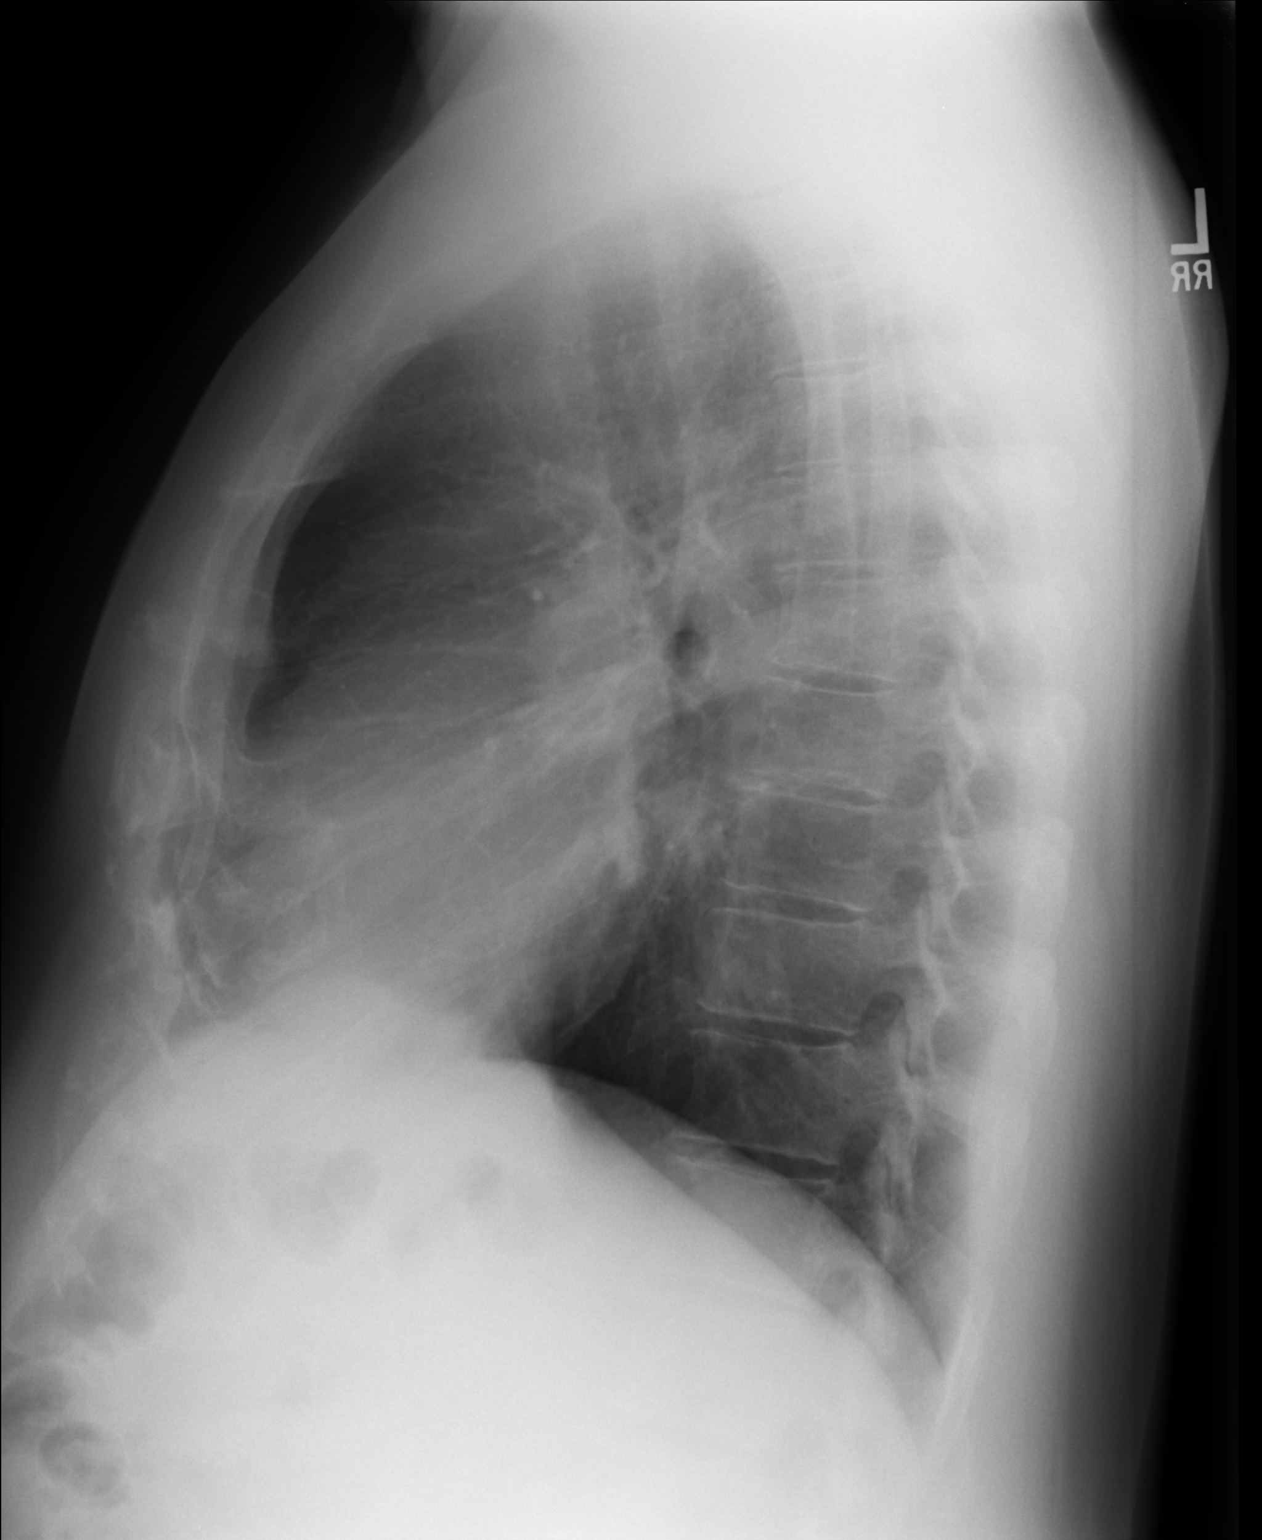

[2 of 2 positions shown; findings below may reference images not displayed]

FINDINGS: The heart size and mediastinal contours are within normal limits.

Subtle asymmetric opacity again seen in the right middle lobe,
without significant change, and suspicious for bronchopneumonia.
Left lung is clear. No evidence of pleural effusion.
IMPRESSION: No significant change in mild asymmetric opacity in right middle
lobe, suspicious for mild bronchopneumonia. Followup PA and lateral
chest X-ray is recommended in 3-4 weeks following trial of
antibiotic therapy to ensure resolution and exclude underlying
malignancy.

## 2015-09-11 ENCOUNTER — Telehealth: Payer: Self-pay | Admitting: Internal Medicine

## 2015-09-11 ENCOUNTER — Other Ambulatory Visit: Payer: Self-pay | Admitting: Internal Medicine

## 2015-09-11 NOTE — Telephone Encounter (Signed)
°*  STAT* If patient is at the pharmacy, call can be transferred to refill team.   1. Which medications need to be refilled? (please list name of each medication and dose if known)  Metoprolol   2. Which pharmacy/location (including street and city if local pharmacy) is medication to be sent to?[facility] [place]    3. Do they need a 30 day or 90 day supply? 90  Appt is on 11/06/15 at 930am

## 2015-09-13 IMAGING — CT CT CHEST W/ CM
2 of 3 series · 15 of 36 positions shown, 18 images · IV contrast (omnipaque)
Comparison: 11/17/2014

CLINICAL DATA: Fever of unknown origin despite antibiotic therapy

EXAM:
CT CHEST WITH CONTRAST
TECHNIQUE: Multidetector CT imaging of the chest was performed during
intravenous contrast administration.
CONTRAST:  75mL OMNIPAQUE IOHEXOL 300 MG/ML  SOLN

[Series 2: chest 5.0 b31f · axial · 0.75mm/px · z∈[-385,-110]mm · 12 of 65 slices shown, 15 images]
[im 5/65  mediastinal]
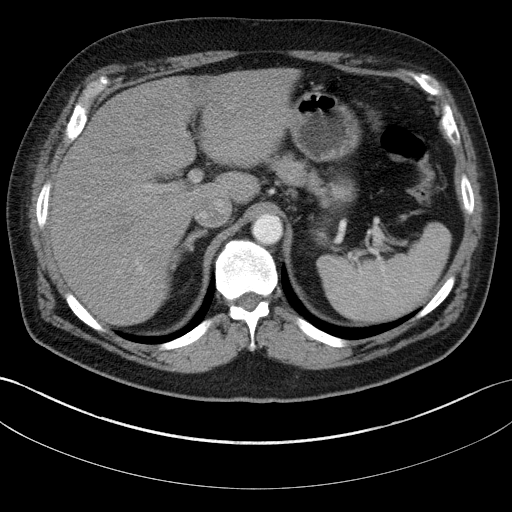
[im 5/65  lung]
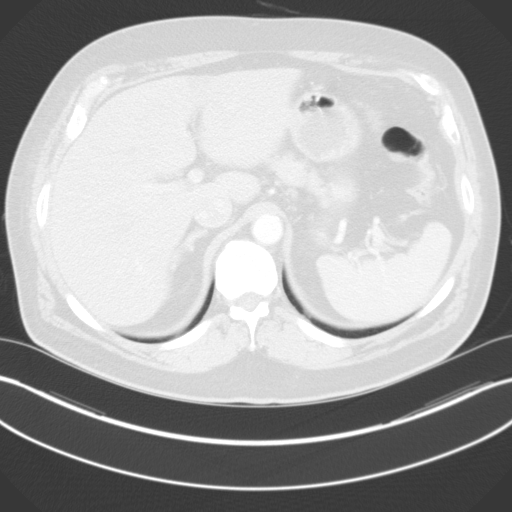
[im 10/65  lung]
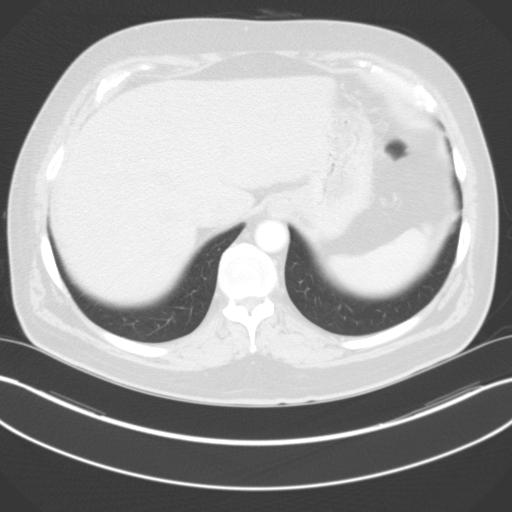
[im 15/65  lung]
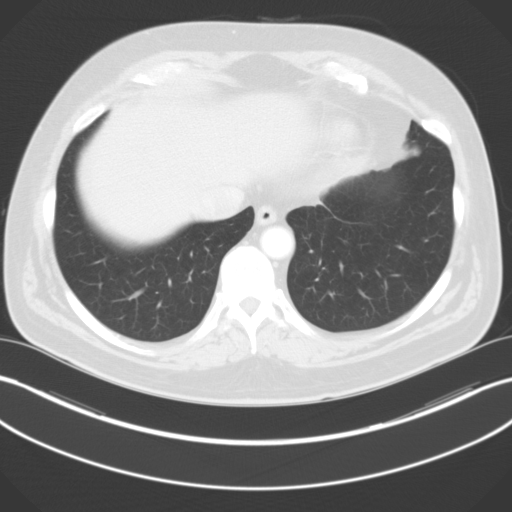
[im 19/65  lung]
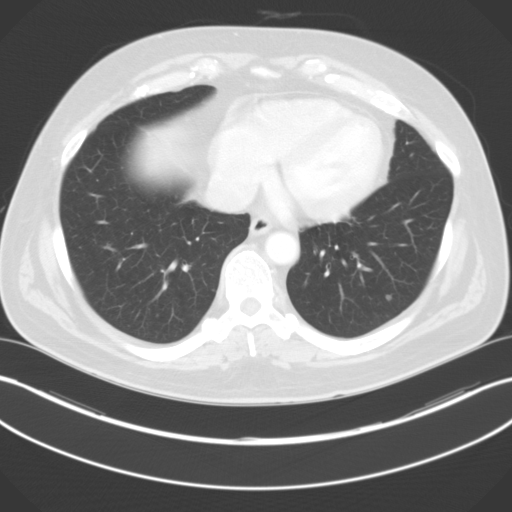
[im 24/65  mediastinal]
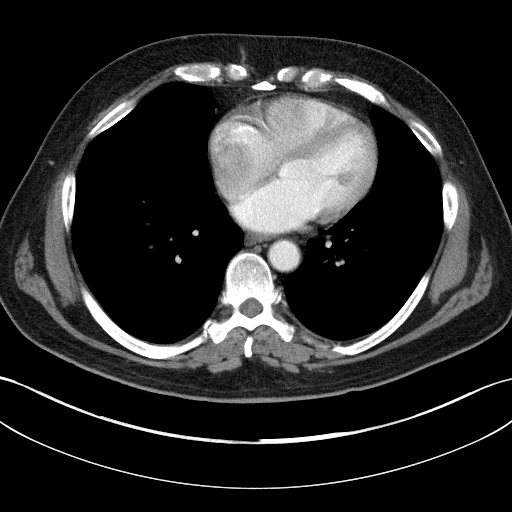
[im 24/65  lung]
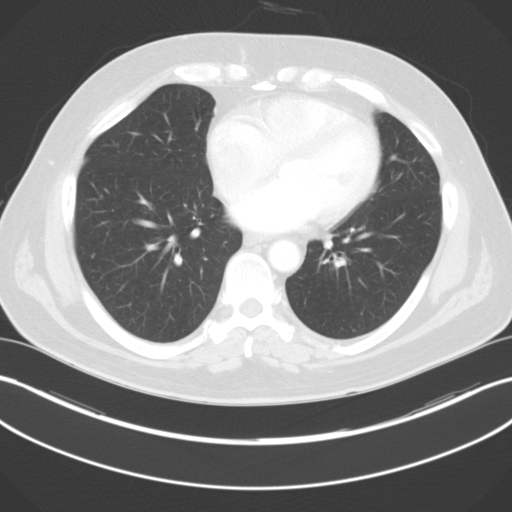
[im 29/65  lung]
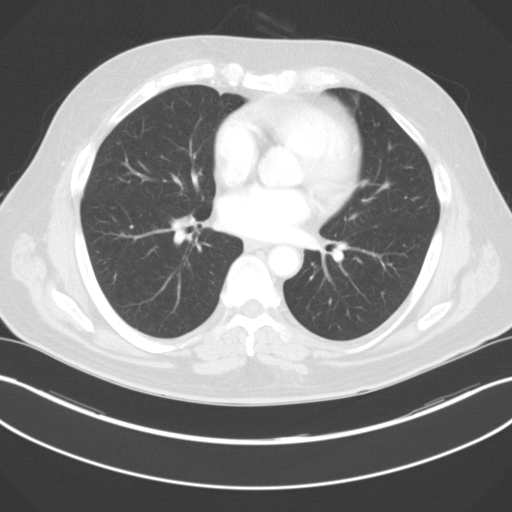
[im 36/65  lung]
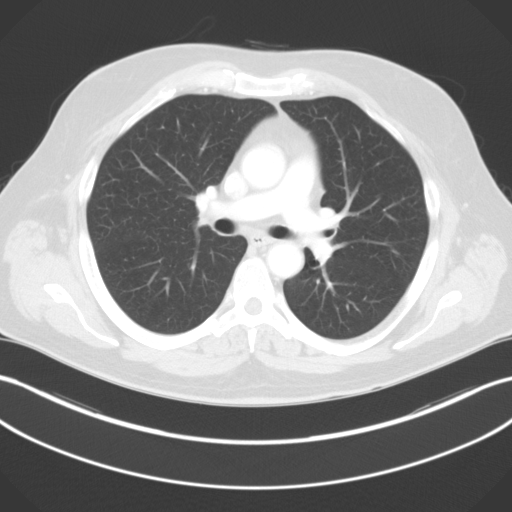
[im 41/65  lung]
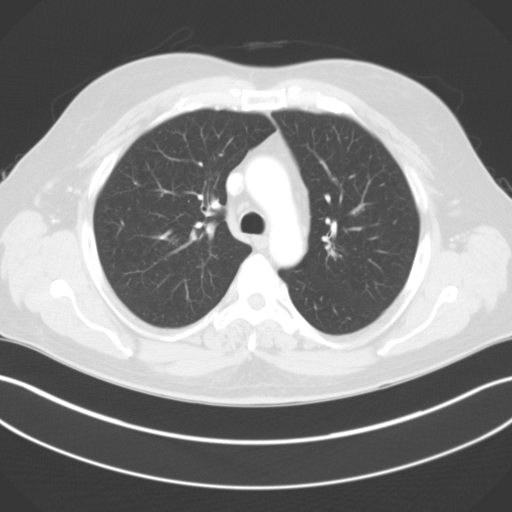
[im 46/65  mediastinal]
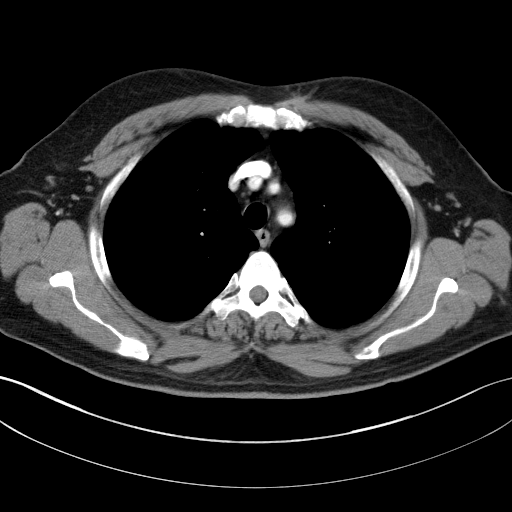
[im 46/65  lung]
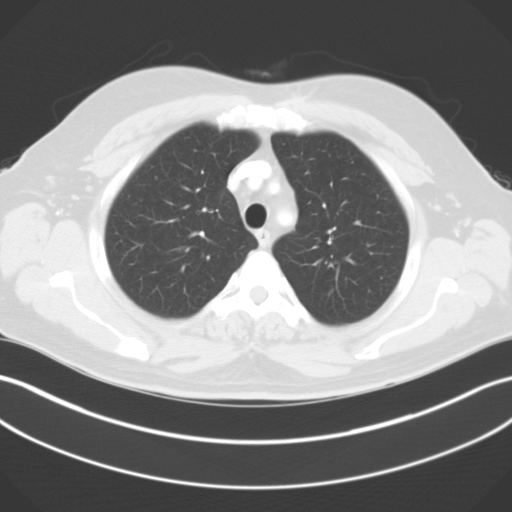
[im 50/65  lung]
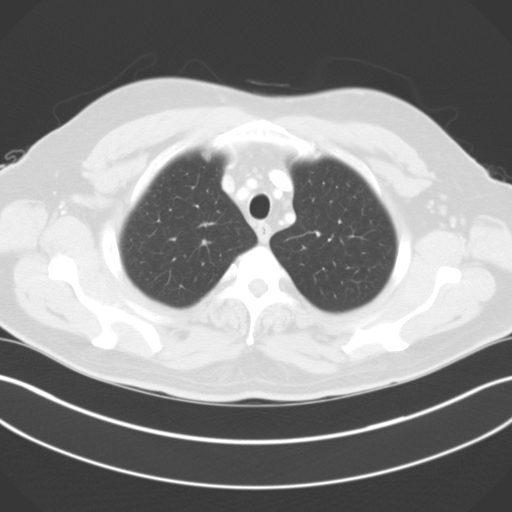
[im 55/65  lung]
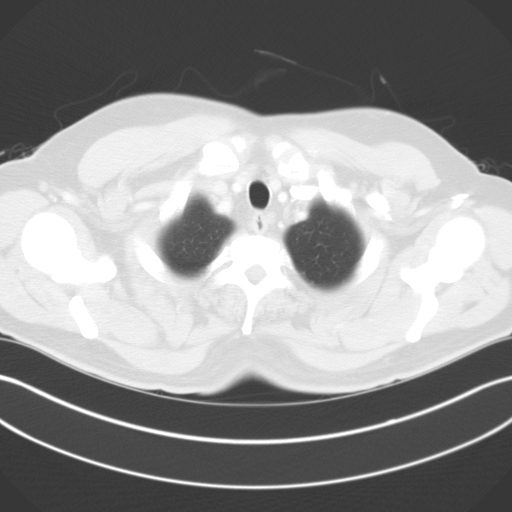
[im 60/65  lung]
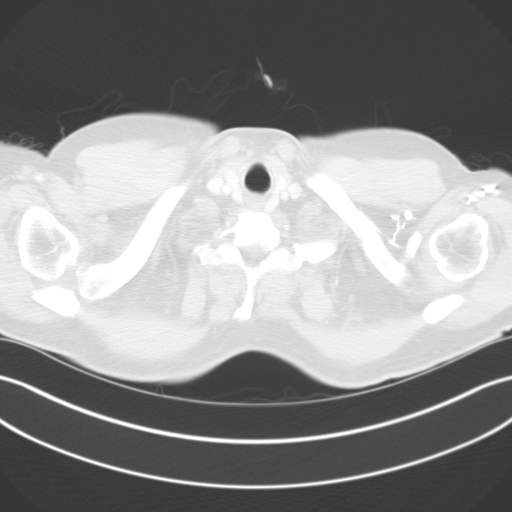

[Series 6: chest 3.0 coronal · coronal · 0.64mm/px · 3 of 91 slices shown]
[im 19/91  lung]
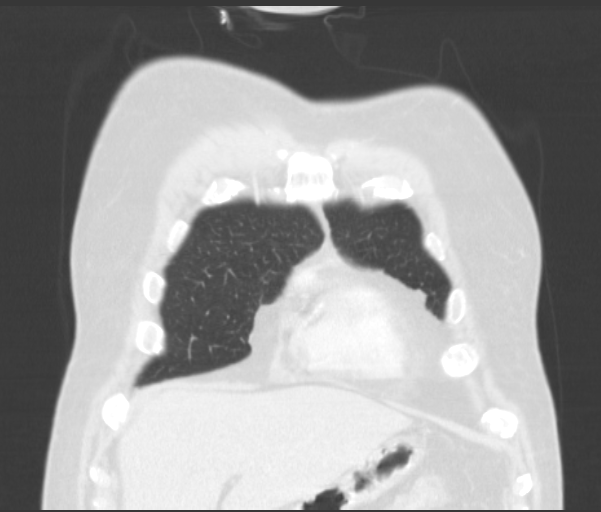
[im 37/91  lung]
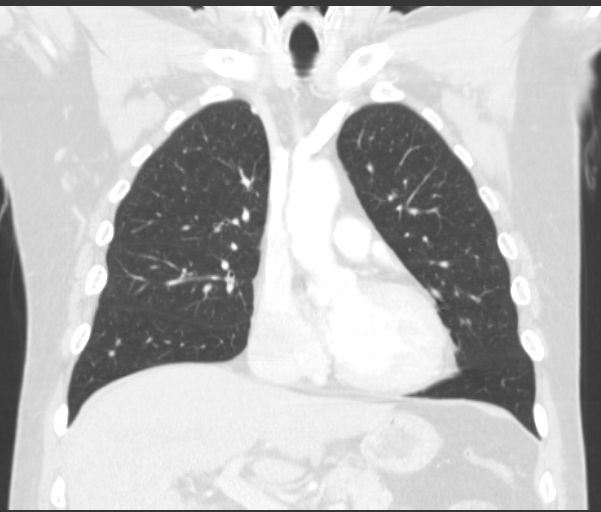
[im 55/91  lung]
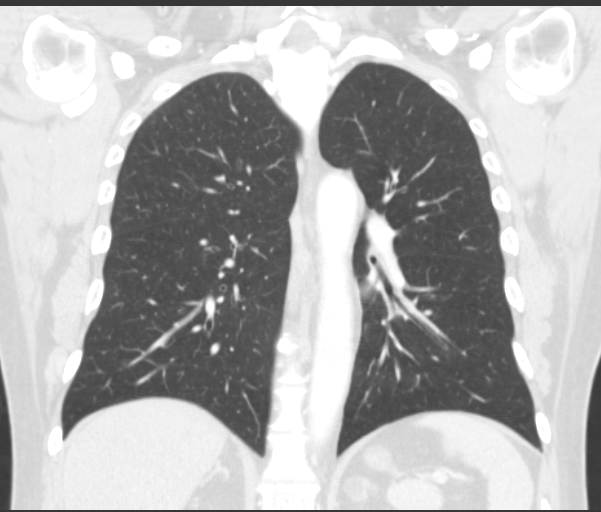

[15 of 36 positions shown; findings below may reference images not displayed]

FINDINGS: The lungs are well aerated bilaterally without focal confluent
infiltrate. The changes seen on recent chest x-ray are not Genhao out
on this exam. A tiny left lower lobe nodule is noted which measures
5 mm in dimension. This is best seen on image number 47 of series 3.
A few tiny subpleural nodules are noted measuring 1-2 mm.

The thoracic inlet is within normal limits. The thoracic aorta and
its branches are unremarkable. Mild coronary calcifications are
seen. No hilar or mediastinal adenopathy is noted. No large central
pulmonary embolus is seen.

The visualized upper abdomen is within normal limits.
IMPRESSION: No abnormality to correspond with that seen on recent chest x-ray.
No findings to correspond with the patient's given clinical history
of fever.

5 mm left lower lobe nodule. If the patient is at high risk for
bronchogenic carcinoma, follow-up chest CT at 6-12 months is
recommended. If the patient is at low risk for bronchogenic
carcinoma, follow-up chest CT at 12 months is recommended. This
recommendation follows the consensus statement: Guidelines for
Management of Small Pulmonary Nodules Detected on CT Scans: A
Statement from the [HOSPITAL] as published in Radiology
4229;[DATE].

No other focal abnormality is seen.

## 2015-09-30 IMAGING — CT CT ABD-PELV W/ CM
2 of 5 series · 16 of 46 positions shown, 18 images · IV contrast (APPLIED)
Comparison: Chest CT 11/21/2014

CLINICAL DATA: Fever of unknown origin. Fever for 5 weeks. Treated
for pneumonia 2 weeks prior with persistent fevers.

EXAM:
CT ABDOMEN AND PELVIS WITH CONTRAST
TECHNIQUE: Multidetector CT imaging of the abdomen and pelvis was performed
using the standard protocol following bolus administration of
intravenous contrast.
CONTRAST:  100mL OE1C3H-2TT IOPAMIDOL (OE1C3H-2TT) INJECTION 61%

[Series 2: abd pelvis 5.0 i41s 1 · axial · 0.83mm/px · z∈[-511,-61]mm · 13 of 100 slices shown, 15 images]
[im 5/100  soft-tissue]
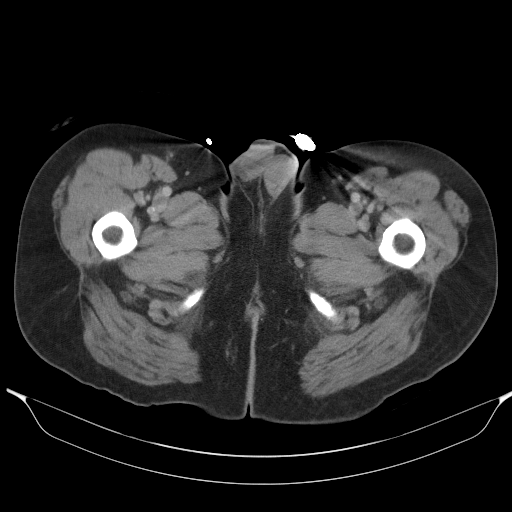
[im 5/100  bone]
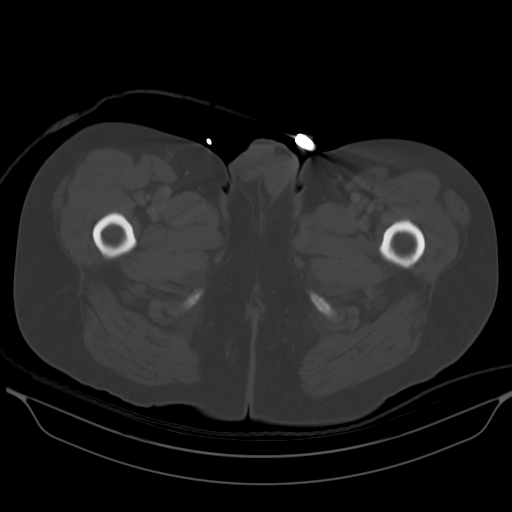
[im 15/100  soft-tissue]
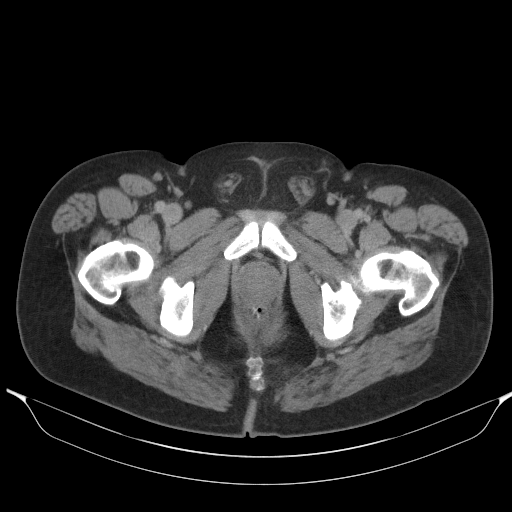
[im 20/100  soft-tissue]
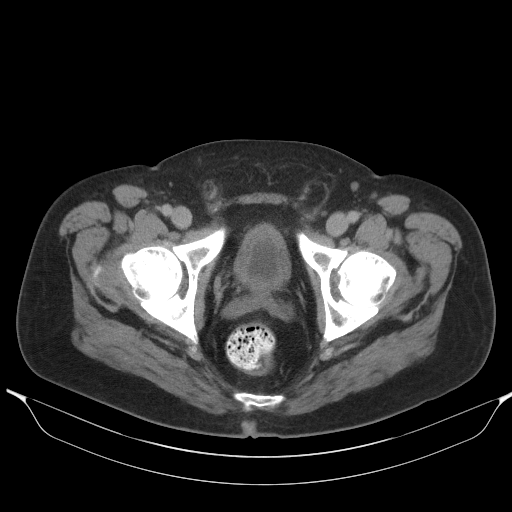
[im 30/100  soft-tissue]
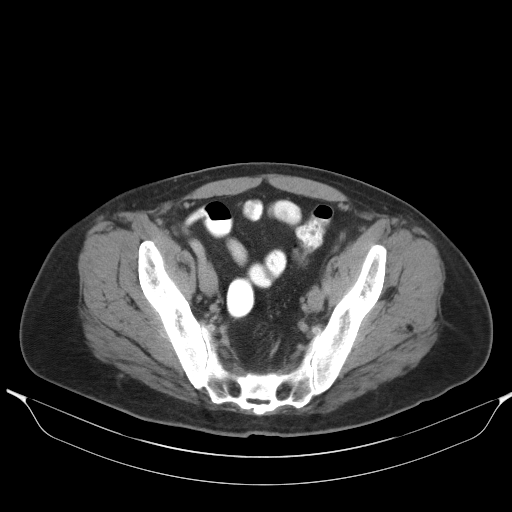
[im 35/100  soft-tissue]
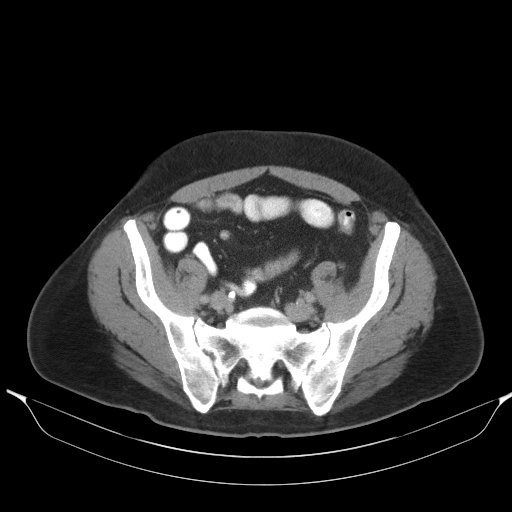
[im 45/100  soft-tissue]
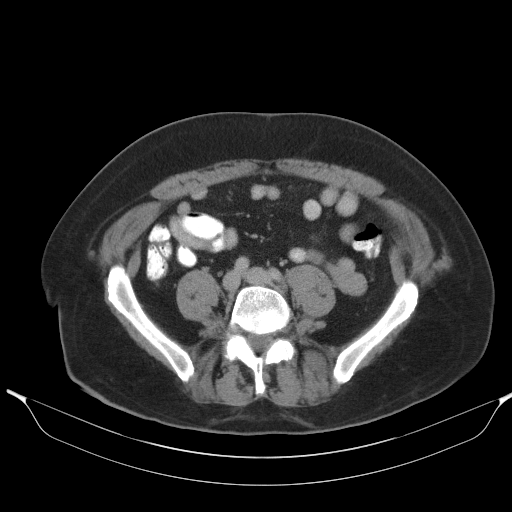
[im 50/100  soft-tissue]
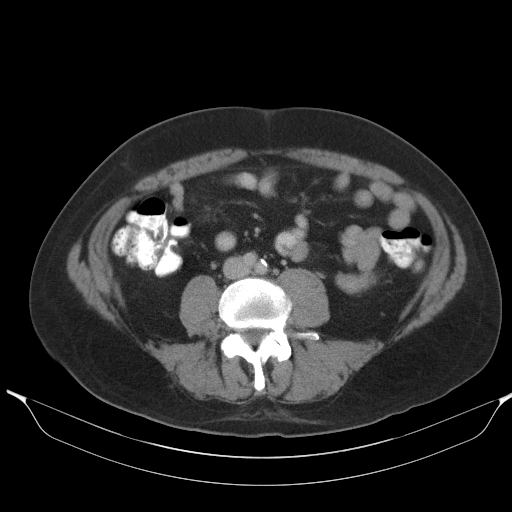
[im 55/100  soft-tissue]
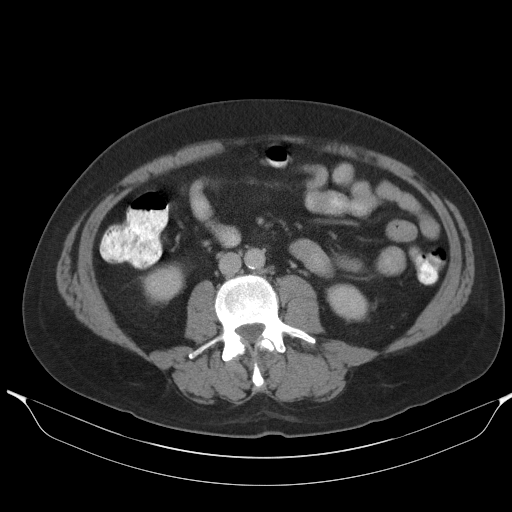
[im 65/100  soft-tissue]
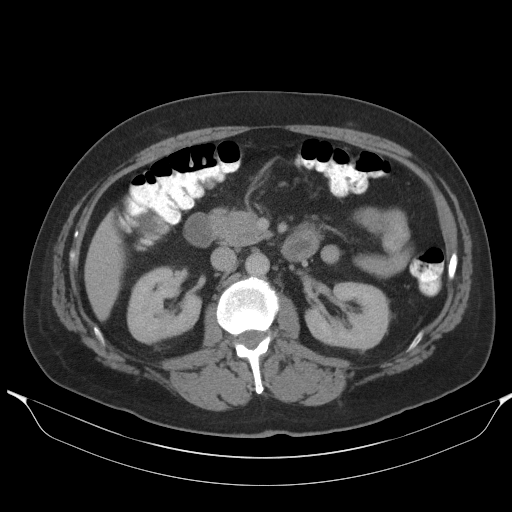
[im 65/100  bone]
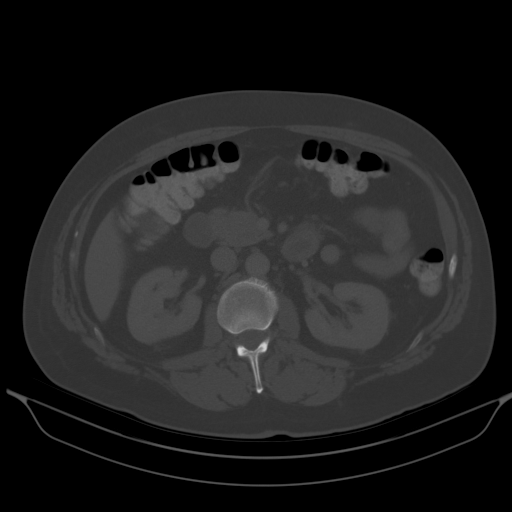
[im 70/100  soft-tissue]
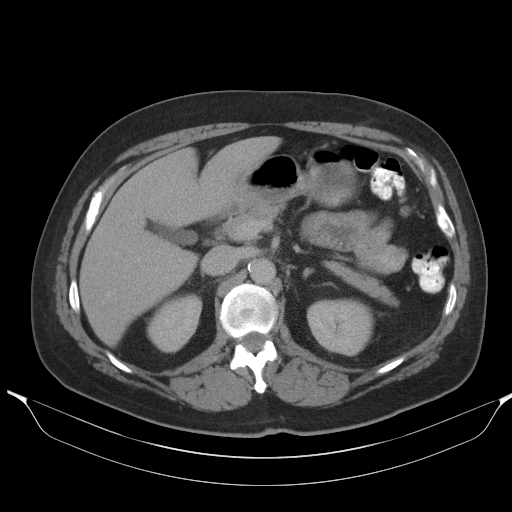
[im 80/100  soft-tissue]
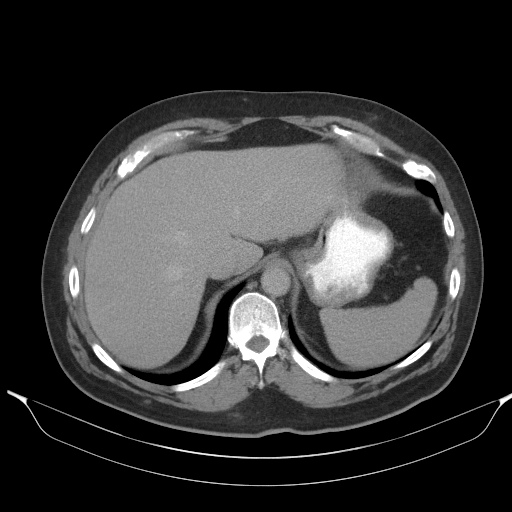
[im 85/100  soft-tissue]
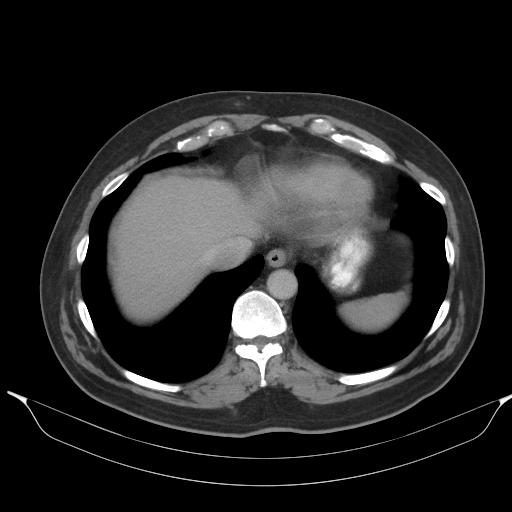
[im 95/100  soft-tissue]
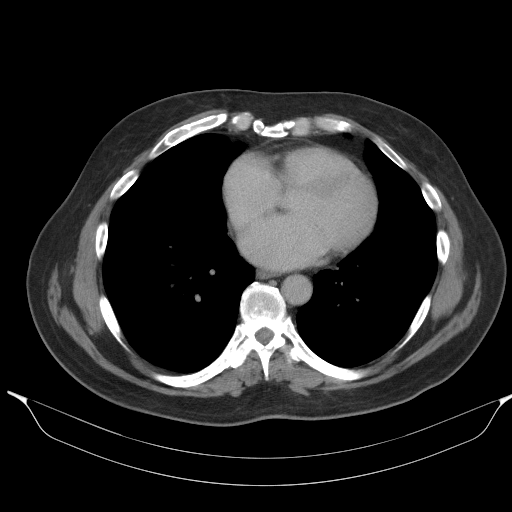

[Series 3: abd pelvis 3.0 spo cor · coronal · 0.82mm/px · 3 of 81 slices shown]
[im 27/81  soft-tissue]
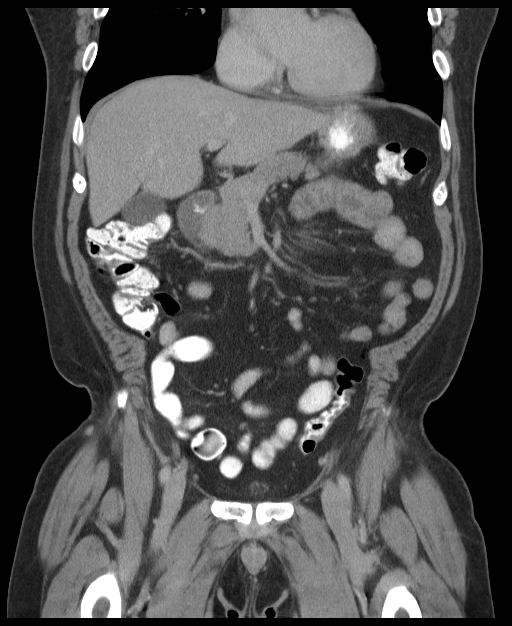
[im 36/81  soft-tissue]
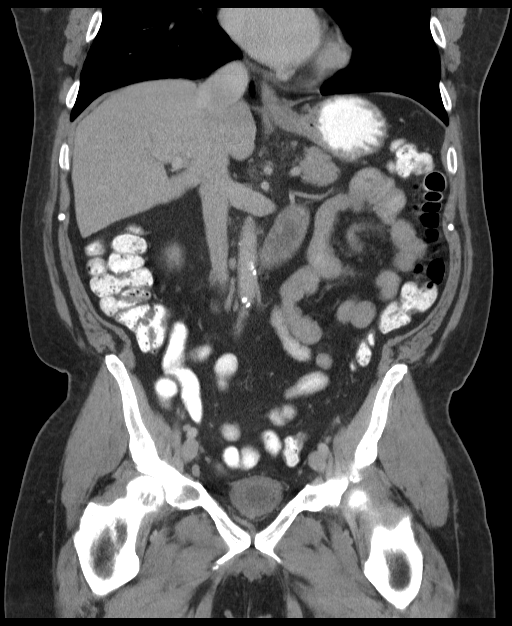
[im 45/81  soft-tissue]
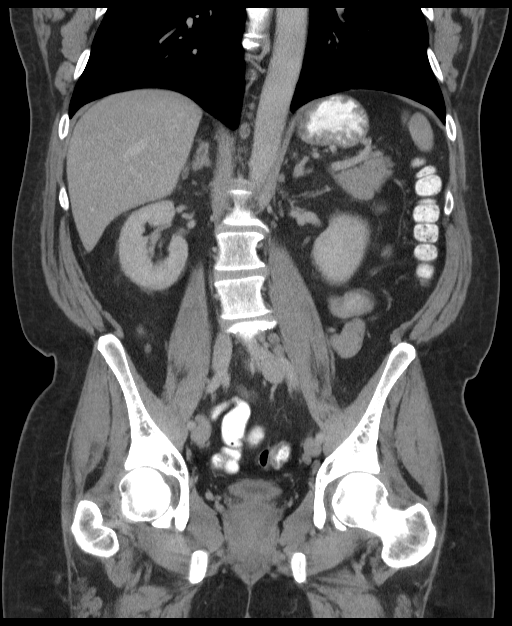

[16 of 46 positions shown; findings below may reference images not displayed]

FINDINGS: Lower chest: 4 mm nodule in the right middle lobe on image 1. 5 mm
nodule at left lung base on image 8.

Hepatobiliary: No focal hepatic lesion. No biliary duct dilatation.
Gallbladder is normal. Common bile duct is normal.

Pancreas: Pancreas is normal. No ductal dilatation. No pancreatic
inflammation.

Spleen: Normal spleen

Adrenals/urinary tract: Small nodule of the left adrenal gland has
low attenuation consistent with a small adenoma or myelolipoma.
Right adrenal glands normal. No nephrolithiasis or ureterolithiasis.
No obstructive uropathy. No evidence of pyelonephritis. Ureters and
bladder normal. The bladder wall is thickened to 11 mm.

Stomach/Bowel: Stomach, small bowel, appendix, and cecum are normal.
The colon and rectosigmoid colon are normal.

Vascular/Lymphatic: Abdominal aorta is normal caliber. There is no
retroperitoneal or periportal lymphadenopathy. No pelvic
lymphadenopathy.

Reproductive: Prostate is normal. Small fat filled inguinal hernias.

Musculoskeletal: No aggressive osseous

Other: No abscess or fluid.
IMPRESSION: 1. No clear source infection in the abdomen pelvis.
2. Mild bladder wall thickening likely relates to decompression.
3. Gallbladder and appendix appear normal.
4. No adenopathy or abscess.
5. Small pulmonary nodules at lung bases. If the patient is at high
risk for bronchogenic carcinoma, follow-up chest CT at 6-12 months
is recommended. If the patient is at low risk for bronchogenic
carcinoma, follow-up chest CT at 12 months is recommended. This
recommendation follows the consensus statement: Guidelines for
Management of Small Pulmonary Nodules Detected on CT Scans: A
Statement from the [HOSPITAL] as published in Radiology

## 2015-10-04 ENCOUNTER — Other Ambulatory Visit: Payer: Self-pay | Admitting: Internal Medicine

## 2015-10-29 ENCOUNTER — Other Ambulatory Visit: Payer: Self-pay | Admitting: Internal Medicine

## 2015-11-06 ENCOUNTER — Ambulatory Visit (INDEPENDENT_AMBULATORY_CARE_PROVIDER_SITE_OTHER): Payer: Federal, State, Local not specified - PPO | Admitting: Internal Medicine

## 2015-11-06 ENCOUNTER — Encounter: Payer: Self-pay | Admitting: Internal Medicine

## 2015-11-06 ENCOUNTER — Other Ambulatory Visit: Payer: Self-pay | Admitting: Internal Medicine

## 2015-11-06 VITALS — BP 134/106 | HR 88 | Ht 70.0 in | Wt 213.0 lb

## 2015-11-06 DIAGNOSIS — I10 Essential (primary) hypertension: Secondary | ICD-10-CM | POA: Diagnosis not present

## 2015-11-06 DIAGNOSIS — I4891 Unspecified atrial fibrillation: Secondary | ICD-10-CM | POA: Diagnosis not present

## 2015-11-06 MED ORDER — LISINOPRIL 5 MG PO TABS: 5.0000 mg | ORAL_TABLET | Freq: Every day | ORAL | Status: DC

## 2015-11-06 MED ORDER — NITROGLYCERIN 0.4 MG SL SUBL: 0.4000 mg | SUBLINGUAL_TABLET | SUBLINGUAL | Status: DC | PRN

## 2015-11-06 MED ORDER — METOPROLOL TARTRATE 50 MG PO TABS: 75.0000 mg | ORAL_TABLET | Freq: Two times a day (BID) | ORAL | Status: DC

## 2015-11-06 MED ORDER — ROSUVASTATIN CALCIUM 40 MG PO TABS: 40.0000 mg | ORAL_TABLET | Freq: Every day | ORAL | Status: DC

## 2015-11-06 NOTE — Patient Instructions (Signed)
Medication Instructions:  Your physician has recommended you make the following change in your medication:  1) Start Lisinopril 5mg  daily---call back if BP's not coming down   Labwork: None ordered   Testing/Procedures: None ordered   Follow-Up: Your physician wants you to follow-up in: 12 months with Dr Court Joyaylor You will receive a reminder letter in the mail two months in advance. If you don't receive a letter, please call our office to schedule the follow-up appointment.   Any Other Special Instructions Will Be Listed Below (If Applicable).     If you need a refill on your cardiac medications before your next appointment, please call your pharmacy.

## 2015-11-06 NOTE — Progress Notes (Signed)
HPI Mr. Hartsell returns today for followup. He is a very pleasant 60 year old man with chronic atrial fibrillation and hypertension. In the interim, he had developed a chronic febrile illness which lasted 2 months before finally resolving with  No clear diagnosis. He is retired and exercises regularly playing racquetball, walking on the treadmill, and lifting weights. He has no limitations to his activity. He denies palpitations. He checks his heart rate regularly and notes that it typically is in the 80-90 bpm range. His ED has improved. His blood pressure has been a bit on the high side. No Known Allergies   Current Outpatient Prescriptions  Medication Sig Dispense Refill  . aspirin EC 81 MG tablet Take 1 tablet (81 mg total) by mouth daily. 150 tablet 2  . cholecalciferol (VITAMIN D) 1000 units tablet Take 1,000 Units by mouth 2 (two) times daily.    . Coenzyme Q10 (CO Q 10) 100 MG CAPS Take 100 mg by mouth every morning.      . cyanocobalamin 500 MCG tablet Take 500 mcg by mouth every morning.      . metoprolol (LOPRESSOR) 50 MG tablet TAKE 1 AND 1/2 TABLETS BY MOUTH TWICE DAILY 270 tablet 0  . nitroGLYCERIN (NITROSTAT) 0.4 MG SL tablet Place 1 tablet (0.4 mg total) under the tongue every 5 (five) minutes as needed (MAX 3 TABLETS). For chest pain 25 tablet 11  . Omega-3 Fatty Acids (FISH OIL) 1200 MG CAPS Take 1 capsule by mouth 2 (two) times daily.     Marland Kitchen OVER THE COUNTER MEDICATION Place 2 drops into both eyes every morning. Murine Eye Drops    . Probiotic Product (PROBIOTIC PO) Take 1 tablet by mouth every morning.      Marland Kitchen QUERCETIN PO Take 500 mg by mouth every morning.      . rosuvastatin (CRESTOR) 40 MG tablet TAKE 1 TABLET BY MOUTH EVERY DAY 30 tablet 3  . Saw Palmetto, Serenoa repens, (SAW PALMETTO BERRIES) 540 MG CAPS Take 1 capsule by mouth 2 (two) times daily.      . tadalafil (CIALIS) 20 MG tablet Take 1 tablet (20 mg total) by mouth daily as needed for erectile dysfunction. 10 tablet  3  . vitamin C (ASCORBIC ACID) 500 MG tablet Take 500 mg by mouth 2 (two) times daily.      . vitamin E 400 UNIT capsule Take 400 Units by mouth 2 (two) times daily.       No current facility-administered medications for this visit.     Past Medical History  Diagnosis Date  . HTN (hypertension)   . Atrial fibrillation (HCC)     Echocardiogram 05/20/11: EF 55-65%, moderate LAE, mild RAE;  failed DCCV 06/2011;  Flecainided started 07/2011;  DCCV 08/06/11  . Mild obstructive sleep apnea     saw Dr. Shelle Iron 3/13:  conservative mgmt for now  . HLD (hyperlipidemia)     ROS:   All systems reviewed and negative except as noted in the HPI.   Past Surgical History  Procedure Laterality Date  . Tonsillectomy    . Cardioversion  07/02/2011    Procedure: CARDIOVERSION;  Surgeon: Wendall Stade, MD;  Location: [facility] OR;  Service: Cardiovascular;  Laterality: N/A;  . Cardioversion  08/06/2011    Procedure: CARDIOVERSION;  Surgeon: [name], MD;  Location: [facility] OR;  Service: Cardiovascular;  Laterality: N/A;     Family History  Problem Relation Age of Onset  . Cancer - Lung Mother   .  Raynaud syndrome Mother   . Hypertension Brother   . Hyperlipidemia Brother   . Diabetes Maternal Grandmother   . Cancer Father      Social History   Social History  . Marital Status: Divorced    Spouse Name: N/A  . Number of Children: N/A  . Years of Education: N/A   Occupational History  . retired     Corporate treasurerprev worked as an Pharmacist, hospitalexec. w/ Research officer, political partypostal service   Social History Main Topics  . Smoking status: Never Smoker   . Smokeless tobacco: Never Used  . Alcohol Use: No     Comment: occassionally  . Drug Use: No  . Sexual Activity: Not on file   Other Topics Concern  . Not on file   Social History Narrative   Divorced. Now single. [facility]. Dating seriously; duration 1.5 years.       BP 134/106 mmHg  Pulse 88  Ht 5\' 10"  (1.778 m)  Wt 213 lb (96.616 kg)  BMI 30.56 kg/m2  Physical  Exam:  Well appearing middle-aged man, NAD HEENT: Unremarkable Neck:  No JVD, no thyromegally Lungs:  Clear with no wheezes, rales, or rhonchi. HEART:  IRegular rate rhythm, no murmurs, no rubs, no clicks Abd:  soft, positive bowel sounds, no organomegally, no rebound, no guarding Ext:  2 plus pulses, no edema, no cyanosis, no clubbing Skin:  No rashes no nodules Neuro:  CN II through XII intact, motor grossly intact  EKG Atrial fibrillation with a controlled ventricular response  Assess/Plan:  1. Atrial fib with a controlled VR - he is stable at this point. Will continue his AV nodal blocking agents. 2. HTN - his blood pressure is not well controlled. Will plan to restart his lisinopril at 5 mg daily. If his pressure is not better, then will go up to 10 mg daily. 3. Dyslipidemia - he will continue his statin therapy.  Leonia ReevesGregg Chalisa Kobler,M.D.

## 2016-10-04 ENCOUNTER — Other Ambulatory Visit: Payer: Self-pay | Admitting: Internal Medicine

## 2016-10-04 DIAGNOSIS — I10 Essential (primary) hypertension: Secondary | ICD-10-CM

## 2016-11-03 ENCOUNTER — Other Ambulatory Visit: Payer: Self-pay | Admitting: Internal Medicine

## 2016-11-03 DIAGNOSIS — I10 Essential (primary) hypertension: Secondary | ICD-10-CM

## 2016-11-17 NOTE — Progress Notes (Signed)
Cardiology Office Note Date:  11/19/2016  Patient ID:  Charles Mills, DOB 1956-04-23, MRN [id number] PCP:  Richmond Campbell., PA-C  Cardiologist:  Dr. Ladona Ridgel   Chief Complaint: annual visit  History of Present Illness: Charles Mills is a 61 y.o. male with history of permanent AFib, HTN, HLD comes in today to be seen for Dr. Ladona Ridgel, last seen by him in May 2017, at that time doing well, exercising regularly without difficulties, BP was elevated and rx lisinopril.  He comes in today feeling well.  He jogs on the treadmill every morning and does racquetball in the afternoons without exertional intolerances or changes.  Denies any kindof CP, palpitations or SOB, no dizziness, near syncope or syncope.  He is unaware of his AF.  He says he keeps a close eye on his HR with his FitBit and has made an observation that in the evenings his HR tends to be 80-90 range, slower otherwise.  He has not seen his PCP in a couple years, has been feeling very well.  Past Medical History:  Diagnosis Date  . Atrial fibrillation (HCC)    Echocardiogram 05/20/11: EF 55-65%, moderate LAE, mild RAE;  failed DCCV 06/2011;  Flecainided started 07/2011;  DCCV 08/06/11  . HLD (hyperlipidemia)   . HTN (hypertension)   . Mild obstructive sleep apnea    saw Dr. Shelle Iron 3/13:  conservative mgmt for now    Past Surgical History:  Procedure Laterality Date  . CARDIOVERSION  07/02/2011   Procedure: CARDIOVERSION;  Surgeon: Wendall Stade, MD;  Location: [facility] OR;  Service: Cardiovascular;  Laterality: N/A;  . CARDIOVERSION  08/06/2011   Procedure: CARDIOVERSION;  Surgeon: [name], MD;  Location: [facility] OR;  Service: Cardiovascular;  Laterality: N/A;  . TONSILLECTOMY      Current Outpatient Prescriptions  Medication Sig Dispense Refill  . aspirin EC 81 MG tablet Take 1 tablet (81 mg total) by mouth daily. 150 tablet 2  . cholecalciferol (VITAMIN D) 1000 units tablet Take 1,000 Units by mouth 2 (two) times daily.    . Coenzyme  Q10 (CO Q 10) 100 MG CAPS Take 100 mg by mouth every morning.      . cyanocobalamin 500 MCG tablet Take 500 mcg by mouth every morning.      Marland Kitchen lisinopril (PRINIVIL,ZESTRIL) 5 MG tablet TAKE 1 TABLET(5 MG) BY MOUTH DAILY 90 tablet 0  . metoprolol tartrate (LOPRESSOR) 50 MG tablet TAKE 1 AND 1/2 TABLETS(75 MG) BY MOUTH TWICE DAILY 90 tablet 0  . nitroGLYCERIN (NITROSTAT) 0.4 MG SL tablet PLACE 1 TABLET UNDER THE TONGUE EVERY 5 MINUTES AS NEEDED FOR CHEST PAIN*MAX 3 TABLETS* 75 tablet 1  . Omega-3 Fatty Acids (FISH OIL) 1200 MG CAPS Take 1 capsule by mouth 2 (two) times daily.     Marland Kitchen OVER THE COUNTER MEDICATION Place 2 drops into both eyes every morning. Murine Eye Drops    . Probiotic Product (PROBIOTIC PO) Take 1 tablet by mouth every morning.      Marland Kitchen QUERCETIN PO Take 500 mg by mouth every morning.      . rosuvastatin (CRESTOR) 40 MG tablet Take 1 tablet (40 mg total) by mouth daily. 90 tablet 3  . Saw Palmetto, Serenoa repens, (SAW PALMETTO BERRIES) 540 MG CAPS Take 1 capsule by mouth 2 (two) times daily.      . tadalafil (CIALIS) 20 MG tablet Take 1 tablet (20 mg total) by mouth daily as needed for erectile dysfunction. 10 tablet 3  .  vitamin C (ASCORBIC ACID) 500 MG tablet Take 500 mg by mouth 2 (two) times daily.      . vitamin E 400 UNIT capsule Take 400 Units by mouth 2 (two) times daily.       No current facility-administered medications for this visit.     Allergies:   Patient has no known allergies.   Social History:  The patient  reports that he has never smoked. He has never used smokeless tobacco. He reports that he does not drink alcohol or use drugs.   Family History:  The patient's family history includes Cancer in his father; Cancer - Lung in his mother; Diabetes in his maternal grandmother; Hyperlipidemia in his brother; Hypertension in his brother; Raynaud syndrome in his mother.  ROS:  Please see the history of present illness.  All other systems are reviewed and otherwise  negative.   PHYSICAL EXAM:  VS:  BP 112/82   Pulse 74   Ht 5\' 10"  (1.778 m)   Wt 203 lb (92.1 kg)   BMI 29.13 kg/m  BMI: Body mass index is 29.13 kg/m. Well nourished, well developed, in no acute distress  HEENT: normocephalic, atraumatic  Neck: no JVD, carotid bruits or masses Cardiac:  IRRR; no significant murmurs, no rubs, or gallops Lungs:   CTA b/l, no wheezing, rhonchi or rales  Abd: soft, nontender MS: no deformity or atrophy Ext: no edema  Skin: warm and dry, no rash Neuro:  No gross deficits appreciated Psych: euthymic mood, full affect   EKG:  Done today shows AFib, 82bom, appears unchanged  12/01/14: TTE Study Conclusions - Left ventricle: The cavity size was normal. Wall thickness was   normal. Systolic function was normal. The estimated ejection   fraction was in the range of 55% to 60%. - Left atrium: The atrium was moderately dilated. - Right atrium: The atrium was mildly dilated. - Atrial septum: No defect or patent foramen ovale was identified. - Pericardium, extracardiac: A trivial pericardial effusion was   identified posterior to the heart. Impressions: - No cardiac source of embolism was identified, but cannot be ruled   out on the basis of this examination.  Recent Labs: No results found for requested labs within last 8760 hours.  No results found for requested labs within last 8760 hours.   CrCl cannot be calculated (Patient's most recent lab result is older than the maximum 21 days allowed.).   Wt Readings from Last 3 Encounters:  11/19/16 203 lb (92.1 kg)  11/06/15 213 lb (96.6 kg)  02/15/15 207 lb 4 oz (94 kg)     Other studies reviewed: Additional studies/records reviewed today include: summarized above  ASSESSMENT AND PLAN:  1. Permanent AFib     CHA2DS2Vasc remains low at one, not on a/c  2. HTN     Looks good, no changes  3. HLD     On statin  Disposition: will get his lipids/hepatic labs checked (he reports a 1/2 cup  coffee only this AM, non-dairy creamer,no sugar and he prefers to do this AM then come back another day absolutely fasting), and plan to see him back in 1 year, sooner if needed.  He is urged/recommended to re-establish with his PMD for his routine annual visits/labs/screening needs.  Current medicines are reviewed at length with the patient today.  The patient did not have any concerns regarding medicines.  Judith BlonderSigned, Renee Ursy, PA-C 11/19/2016 8:07 AM     [facility] [address]  [place] [address] [phone] (office)  [phone] (fax)

## 2016-11-19 ENCOUNTER — Ambulatory Visit (INDEPENDENT_AMBULATORY_CARE_PROVIDER_SITE_OTHER): Payer: Federal, State, Local not specified - PPO | Admitting: Physician Assistant

## 2016-11-19 VITALS — BP 112/82 | HR 74 | Ht 70.0 in | Wt 203.0 lb

## 2016-11-19 DIAGNOSIS — I10 Essential (primary) hypertension: Secondary | ICD-10-CM | POA: Diagnosis not present

## 2016-11-19 DIAGNOSIS — I482 Chronic atrial fibrillation: Secondary | ICD-10-CM | POA: Diagnosis not present

## 2016-11-19 DIAGNOSIS — E785 Hyperlipidemia, unspecified: Secondary | ICD-10-CM

## 2016-11-19 DIAGNOSIS — I4821 Permanent atrial fibrillation: Secondary | ICD-10-CM

## 2016-11-19 LAB — LIPID PANEL
CHOL/HDL RATIO: 2.1 ratio (ref 0.0–5.0)
Cholesterol, Total: 125 mg/dL (ref 100–199)
HDL: 59 mg/dL (ref >39)
LDL Calculated: 58 mg/dL (ref 0–99)
TRIGLYCERIDES: 39 mg/dL (ref 0–149)
VLDL Cholesterol Cal: 8 mg/dL (ref 5–40)

## 2016-11-19 LAB — HEPATIC FUNCTION PANEL
ALT: 30 IU/L (ref 0–44)
AST: 31 IU/L (ref 0–40)
Albumin: 4.7 g/dL (ref 3.6–4.8)
Alkaline Phosphatase: 39 IU/L (ref 39–117)
BILIRUBIN TOTAL: 0.8 mg/dL (ref 0.0–1.2)
Bilirubin, Direct: 0.23 mg/dL (ref 0.00–0.40)
TOTAL PROTEIN: 7 g/dL (ref 6.0–8.5)

## 2016-11-19 NOTE — Patient Instructions (Signed)
Medication Instructions:    Your physician recommends that you continue on your current medications as directed. Please refer to the Current Medication list given to you today.   If you need a refill on your cardiac medications before your next appointment, please call your pharmacy.  Labwork:  LFT AND LIPID    Testing/Procedures: NONE ORDERED  TODAY    Follow-Up: Your physician wants you to follow-up in: ONE YEAR WITH  Ladona RidgelAYLOR  You will receive a reminder letter in the mail two months in advance. If you don't receive a letter, please call our office to schedule the follow-up appointment.     Any Other Special Instructions Will Be Listed Below (If Applicable).

## 2016-11-27 ENCOUNTER — Other Ambulatory Visit: Payer: Self-pay | Admitting: Internal Medicine

## 2016-11-27 DIAGNOSIS — I10 Essential (primary) hypertension: Secondary | ICD-10-CM

## 2016-12-28 ENCOUNTER — Other Ambulatory Visit: Payer: Self-pay | Admitting: Internal Medicine

## 2016-12-28 DIAGNOSIS — I10 Essential (primary) hypertension: Secondary | ICD-10-CM

## 2017-01-12 ENCOUNTER — Other Ambulatory Visit: Payer: Self-pay | Admitting: Internal Medicine

## 2017-09-27 ENCOUNTER — Other Ambulatory Visit: Payer: Self-pay | Admitting: Internal Medicine

## 2017-09-27 DIAGNOSIS — I10 Essential (primary) hypertension: Secondary | ICD-10-CM

## 2017-09-28 ENCOUNTER — Other Ambulatory Visit: Payer: Self-pay | Admitting: Internal Medicine

## 2017-09-29 NOTE — Telephone Encounter (Signed)
atient Medication Detail    Disp Refills Start End   metoprolol tartrate (LOPRESSOR) 50 MG tablet 90 tablet 11 11/27/2016    Sig - Route: Take 1.5 tablets (75 mg total) by mouth 2 (two) times daily. - Oral   Sent to pharmacy as: metoprolol tartrate (LOPRESSOR) 50 MG tablet   E-Prescribing Status: Receipt confirmed by pharmacy (11/27/2016 4:08 PM EDT)   Associated Diagnoses   Essential hypertension     Pharmacy   [facility] [phone] - [place], [place] - [address]

## 2017-10-17 ENCOUNTER — Other Ambulatory Visit: Payer: Self-pay | Admitting: Internal Medicine

## 2017-10-17 DIAGNOSIS — I10 Essential (primary) hypertension: Secondary | ICD-10-CM

## 2017-10-19 ENCOUNTER — Other Ambulatory Visit: Payer: Self-pay | Admitting: Internal Medicine

## 2017-10-19 DIAGNOSIS — I10 Essential (primary) hypertension: Secondary | ICD-10-CM

## 2017-11-19 ENCOUNTER — Encounter: Payer: Self-pay | Admitting: Internal Medicine

## 2017-11-19 ENCOUNTER — Ambulatory Visit: Payer: Federal, State, Local not specified - PPO | Admitting: Internal Medicine

## 2017-11-19 VITALS — BP 124/74 | HR 75 | Ht 70.0 in | Wt 188.0 lb

## 2017-11-19 DIAGNOSIS — I10 Essential (primary) hypertension: Secondary | ICD-10-CM

## 2017-11-19 DIAGNOSIS — I482 Chronic atrial fibrillation: Secondary | ICD-10-CM

## 2017-11-19 DIAGNOSIS — E785 Hyperlipidemia, unspecified: Secondary | ICD-10-CM

## 2017-11-19 DIAGNOSIS — I4821 Permanent atrial fibrillation: Secondary | ICD-10-CM

## 2017-11-19 NOTE — Patient Instructions (Signed)

## 2017-11-19 NOTE — Progress Notes (Signed)
HPI [name] returns today for followup. He is a very pleasant 62 year old man with chronic atrial fibrillation and hypertension. He is retired and exercises regularly playing racquetball, walking on the treadmill, and lifting weights. He has no limitations to his activity. He denies palpitations. He checks his heart rate regularly and notes that it typically is in the 80-90 bpm range. His ED has improved. His blood pressure has been better since he lost another 20 lbs.   No Known Allergies   Current Outpatient Medications  Medication Sig Dispense Refill  . aspirin EC 81 MG tablet Take 1 tablet (81 mg total) by mouth daily. 150 tablet 2  . cholecalciferol (VITAMIN D) 1000 units tablet Take 1,000 Units by mouth 2 (two) times daily.    . Coenzyme Q10 (CO Q 10) 100 MG CAPS Take 100 mg by mouth every morning.      . cyanocobalamin 500 MCG tablet Take 500 mcg by mouth every morning.      Marland Kitchen lisinopril (PRINIVIL,ZESTRIL) 5 MG tablet Take 1 tablet (5 mg total) by mouth daily. 90 tablet 3  . metoprolol tartrate (LOPRESSOR) 50 MG tablet TAKE 1 AND 1/2 TABLETS(75 MG) BY MOUTH TWICE DAILY 45 tablet 1  . nitroGLYCERIN (NITROSTAT) 0.4 MG SL tablet PLACE 1 TABLET UNDER THE TONGUE EVERY 5 MINUTES AS NEEDED FOR CHEST PAIN*MAX 3 TABLETS* 75 tablet 1  . Omega-3 Fatty Acids (FISH OIL) 1200 MG CAPS Take 1 capsule by mouth 2 (two) times daily.     Marland Kitchen OVER THE COUNTER MEDICATION Place 2 drops into both eyes every morning. Murine Eye Drops    . Probiotic Product (PROBIOTIC PO) Take 1 tablet by mouth every morning.      Marland Kitchen QUERCETIN PO Take 500 mg by mouth every morning.      . rosuvastatin (CRESTOR) 40 MG tablet TAKE 1 TABLET(40 MG) BY MOUTH DAILY 90 tablet 0  . Saw Palmetto, Serenoa repens, (SAW PALMETTO BERRIES) 540 MG CAPS Take 1 capsule by mouth 2 (two) times daily.      . tadalafil (CIALIS) 20 MG tablet Take 1 tablet (20 mg total) by mouth daily as needed for erectile dysfunction. 10 tablet 3  . vitamin  C (ASCORBIC ACID) 500 MG tablet Take 500 mg by mouth 2 (two) times daily.      . vitamin E 400 UNIT capsule Take 400 Units by mouth 2 (two) times daily.       No current facility-administered medications for this visit.      Past Medical History:  Diagnosis Date  . Atrial fibrillation (HCC)    Echocardiogram 05/20/11: EF 55-65%, moderate LAE, mild RAE;  failed DCCV 06/2011;  Flecainided started 07/2011;  DCCV 08/06/11  . HLD (hyperlipidemia)   . HTN (hypertension)   . Mild obstructive sleep apnea    saw Dr. Shelle Mills 3/13:  conservative mgmt for now    ROS:   All systems reviewed and negative except as noted in the HPI.   Past Surgical History:  Procedure Laterality Date  . CARDIOVERSION  07/02/2011   Procedure: CARDIOVERSION;  Surgeon: Charles Stade, MD;  Location: [facility] OR;  Service: Cardiovascular;  Laterality: N/A;  . CARDIOVERSION  08/06/2011   Procedure: CARDIOVERSION;  Surgeon: Charles Bunting, MD;  Location[facility] OR;  Service: Cardiovascular;  Laterality: N/A;  . TONSILLECTOMY       Family History  Problem Relation Age of Onset  . Cancer - Lung Mother   . Raynaud syndrome  Mother   . Hypertension Brother   . Hyperlipidemia Brother   . Diabetes Maternal Grandmother   . Cancer Father      Social History   Socioeconomic History  . Marital status: Divorced    Spouse name: Not on file  . Number of children: Not on file  . Years of education: Not on file  . Highest education level: Not on file  Occupational History  . Occupation: retired    Comment: prev worked as an Pharmacist, hospitalexec. w/ Research officer, political partypostal service  Social Needs  . Financial resource strain: Not on file  . Food insecurity:    Worry: Not on file    Inability: Not on file  . Transportation needs:    Medical: Not on file    Non-medical: Not on file  Tobacco Use  . Smoking status: Never Smoker  . Smokeless tobacco: Never Used  Substance and Sexual Activity  . Alcohol use: No    Alcohol/week: 0.0 oz    Comment: occassionally    . Drug use: No  . Sexual activity: Not on file  Lifestyle  . Physical activity:    Days per week: Not on file    Minutes per session: Not on file  . Stress: Not on file  Relationships  . Social connections:    Talks on phone: Not on file    Gets together: Not on file    Attends religious service: Not on file    Active member of club or organization: Not on file    Attends meetings of clubs or organizations: Not on file    Relationship status: Not on file  . Intimate partner violence:    Fear of current or ex partner: Not on file    Emotionally abused: Not on file    Physically abused: Not on file    Forced sexual activity: Not on file  Other Topics Concern  . Not on file  Social History Narrative   Divorced. Now single. [facility]. Dating seriously; duration 1.5 years.       Ht 5\' 10"  (1.778 m)   BMI 29.13 kg/m   Physical Exam:  Well appearing 62 yo man, NAD HEENT: Unremarkable Neck:  No JVD, no thyromegally Lymphatics:  No adenopathy Back:  No CVA tenderness Lungs:  Clear HEART:  IRegular rate rhythm, no murmurs, no rubs, no clicks Abd:  soft, positive bowel sounds, no organomegally, no rebound, no guarding Ext:  2 plus pulses, no edema, no cyanosis, no clubbing Skin:  No rashes no nodules Neuro:  CN II through XII intact, motor grossly intact  EKG - atrial fib with a controlled VR  Assess/Plan: 1. Atrial fib - his ventricular rate is well controlled. His CHADSVASC is still 1 and he will continue ASA. When he turns 65, we will plan to start an OAC. 2. HTN - his blood pressure is well controlled on medical therapy now that he has lost weight.   Leonia ReevesGregg [name],M.D.

## 2017-12-07 ENCOUNTER — Other Ambulatory Visit: Payer: Self-pay | Admitting: Internal Medicine

## 2017-12-07 DIAGNOSIS — I10 Essential (primary) hypertension: Secondary | ICD-10-CM

## 2017-12-11 ENCOUNTER — Other Ambulatory Visit: Payer: Self-pay | Admitting: Internal Medicine

## 2017-12-11 DIAGNOSIS — I10 Essential (primary) hypertension: Secondary | ICD-10-CM

## 2018-03-11 ENCOUNTER — Other Ambulatory Visit: Payer: Self-pay | Admitting: Internal Medicine

## 2018-05-14 ENCOUNTER — Other Ambulatory Visit: Payer: Self-pay | Admitting: Internal Medicine

## 2018-05-17 NOTE — Telephone Encounter (Signed)
Outpatient Medication Detail    Disp Refills Start End   rosuvastatin (CRESTOR) 40 MG tablet 90 tablet 2 03/11/2018    Sig: TAKE 1 TABLET(40 MG) BY MOUTH DAILY   Sent to pharmacy as: rosuvastatin (CRESTOR) 40 MG tablet   E-Prescribing Status: Receipt confirmed by pharmacy (03/11/2018 7:45 AM EDT)   Pharmacy   [facility] [phone] - [place], [place] - [address]

## 2018-05-25 ENCOUNTER — Other Ambulatory Visit: Payer: Self-pay | Admitting: Internal Medicine

## 2018-05-25 DIAGNOSIS — I10 Essential (primary) hypertension: Secondary | ICD-10-CM

## 2018-11-09 ENCOUNTER — Other Ambulatory Visit: Payer: Self-pay | Admitting: Internal Medicine

## 2018-11-09 DIAGNOSIS — I10 Essential (primary) hypertension: Secondary | ICD-10-CM

## 2018-11-17 ENCOUNTER — Telehealth: Payer: Self-pay | Admitting: Internal Medicine

## 2018-11-17 NOTE — Telephone Encounter (Signed)
Follow up     Pt returned call. Set up for doxemity video visit with Dr. Ladona Ridgelaylor on 6.16.20. Pt mobile number is listed in appt notes.      Virtual Visit Pre-Appointment Phone Call  "(Name), I am calling you today to discuss your upcoming appointment. We are currently trying to limit exposure to the virus that causes COVID-19 by seeing patients at home rather than in the office."  1. "What is the BEST phone number to call the day of the visit?" - include this in appointment notes  2. Do you have or have access to (through a family member/friend) a smartphone with video capability that we can use for your visit?" a. If yes - list this number in appt notes as cell (if different from BEST phone #) and list the appointment type as a VIDEO visit in appointment notes b. If no - list the appointment type as a PHONE visit in appointment notes  3. Confirm consent - "In the setting of the current Covid19 crisis, you are scheduled for a (phone or video) visit with your provider on (date) at (time).  Just as we do with many in-office visits, in order for you to participate in this visit, we must obtain consent.  If you'd like, I can send this to your mychart (if signed up) or email for you to review.  Otherwise, I can obtain your verbal consent now.  All virtual visits are billed to your insurance company just like a normal visit would be.  By agreeing to a virtual visit, we'd like you to understand that the technology does not allow for your provider to perform an examination, and thus may limit your provider's ability to fully assess your condition. If your provider identifies any concerns that need to be evaluated in person, we will make arrangements to do so.  Finally, though the technology is pretty good, we cannot assure that it will always work on either your or our end, and in the setting of a video visit, we may have to convert it to a phone-only visit.  In either situation, we cannot ensure that  we have a secure connection.  Are you willing to proceed?" STAFF: Did the patient verbally acknowledge consent to telehealth visit? Document YES/NO here: YES  4. Advise patient to be prepared - "Two hours prior to your appointment, go ahead and check your blood pressure, pulse, oxygen saturation, and your weight (if you have the equipment to check those) and write them all down. When your visit starts, your provider will ask you for this information. If you have an Apple Watch or Kardia device, please plan to have heart rate information ready on the day of your appointment. Please have a pen and paper handy nearby the day of the visit as well."  5. Give patient instructions for MyChart download to smartphone OR Doximity/Doxy.me as below if video visit (depending on what platform provider is using)  6. Inform patient they will receive a phone call 15 minutes prior to their appointment time (may be from unknown caller ID) so they should be prepared to answer    TELEPHONE CALL NOTE  Charles Mills has been deemed a candidate for a follow-up tele-health visit to limit community exposure during the Covid-19 pandemic. I spoke with the patient via phone to ensure availability of phone/video source, confirm preferred email & phone number, and discuss instructions and expectations.  I reminded Charles Mills to be prepared with any vital sign and/or  heart rhythm information that could potentially be obtained via home monitoring, at the time of his visit. I reminded Charles Mills to expect a phone call prior to his visit.  [name] 11/17/2018 9:34 AM   INSTRUCTIONS FOR DOWNLOADING THE MYCHART APP TO SMARTPHONE  - The patient must first make sure to have activated MyChart and know their login information - If Apple, go to [employer] and type in MyChart in the search bar and download the app. If Android, ask patient to go to [employer] and type in [place] in the search bar and download the app. The  app is free but as with any other app downloads, their phone may require them to verify saved payment information or Apple/Android password.  - The patient will need to then log into the app with their MyChart username and password, and select [facility] as their healthcare provider to link the account. When it is time for your visit, go to the MyChart app, find appointments, and click Begin Video Visit. Be sure to Select Allow for your device to access the Microphone and Camera for your visit. You will then be connected, and your provider will be with you shortly.  **If they have any issues connecting, or need assistance please contact MyChart service desk [phone])**  **If using a computer, in order to ensure the best quality for their visit they will need to use either of the following Internet Browsers: [employer] or Google Chrome**  IF USING DOXIMITY or DOXY.ME - The patient will receive a link just prior to their visit by text.     FULL LENGTH CONSENT FOR TELE-HEALTH VISIT   I hereby voluntarily request, consent and authorize [facility] and its employed or contracted physicians, physician assistants, nurse practitioners or other licensed health care professionals (the Practitioner), to provide me with telemedicine health care services (the Services") as deemed necessary by the treating Practitioner. I acknowledge and consent to receive the Services by the Practitioner via telemedicine. I understand that the telemedicine visit will involve communicating with the Practitioner through live audiovisual communication technology and the disclosure of certain medical information by electronic transmission. I acknowledge that I have been given the opportunity to request an in-person assessment or other available alternative prior to the telemedicine visit and am voluntarily participating in the telemedicine visit.  I understand that I have the right to withhold or withdraw  my consent to the use of telemedicine in the course of my care at any time, without affecting my right to future care or treatment, and that the Practitioner or I may terminate the telemedicine visit at any time. I understand that I have the right to inspect all information obtained and/or recorded in the course of the telemedicine visit and may receive copies of available information for a reasonable fee.  I understand that some of the potential risks of receiving the Services via telemedicine include:   Delay or interruption in medical evaluation due to technological equipment failure or disruption;  Information transmitted may not be sufficient (e.g. poor resolution of images) to allow for appropriate medical decision making by the Practitioner; and/or   In rare instances, security protocols could fail, causing a breach of personal health information.  Furthermore, I acknowledge that it is my responsibility to provide information about my medical history, conditions and care that is complete and accurate to the best of my ability. I acknowledge that Practitioner's advice, recommendations, and/or decision may be based on  factors not within their control, such as incomplete or inaccurate data provided by me or distortions of diagnostic images or specimens that may result from electronic transmissions. I understand that the practice of medicine is not an exact science and that Practitioner makes no warranties or guarantees regarding treatment outcomes. I acknowledge that I will receive a copy of this consent concurrently upon execution via email to the email address I last provided but may also request a printed copy by calling the office of [facility].    I understand that my insurance will be billed for this visit.   I have read or had this consent read to me.  I understand the contents of this consent, which adequately explains the benefits and risks of the Services being provided via  telemedicine.   I have been provided ample opportunity to ask questions regarding this consent and the Services and have had my questions answered to my satisfaction.  I give my informed consent for the services to be provided through the use of telemedicine in my medical care  By participating in this telemedicine visit I agree to the above.

## 2018-11-17 NOTE — Telephone Encounter (Signed)
New message ° ° ° °LMOM for pt to call back about appt on 06.16.20 with Dr. Taylor. Will offer pt virtual visit through doxemity. Video if pt has a smart phone, phone call if pt does not have a smart phone. If pt calls back, please reach out via secure chat. I will speak to pt.  °

## 2018-11-30 ENCOUNTER — Telehealth (INDEPENDENT_AMBULATORY_CARE_PROVIDER_SITE_OTHER): Payer: Federal, State, Local not specified - PPO | Admitting: Internal Medicine

## 2018-11-30 ENCOUNTER — Other Ambulatory Visit: Payer: Self-pay

## 2018-11-30 DIAGNOSIS — I4821 Permanent atrial fibrillation: Secondary | ICD-10-CM

## 2018-11-30 DIAGNOSIS — I10 Essential (primary) hypertension: Secondary | ICD-10-CM

## 2018-11-30 NOTE — Progress Notes (Signed)
Electrophysiology TeleHealth Note   Due to national recommendations of social distancing due to COVID 19, an audio/video telehealth visit is felt to be most appropriate for this patient at this time.  See MyChart message from today for the patient's consent to telehealth for Charles Mills.   Date:  11/30/2018   ID:  [name], DOB 07-Apr-1956, MRN [id number]  Location: patient's home  Provider location: [address] [place]  Evaluation Performed: Follow-up visit  PCP:  Patient, No Pcp Per  Cardiologist:  No primary care provider on file.  Electrophysiologist:  Dr Lovena Le  Chief Complaint:  "I am doing alright."  History of Present Illness:    Charles Mills is a 63 y.o. male who presents via audio/video conferencing for a telehealth visit today.  Since last being seen in our clinic, the patient reports doing very well. He is a pleasant 63 yo man with chronic atrial fib, HTN, and dyslipidemia. He has done well in the interim. He was doing well.  Today, he denies symptoms of palpitations, chest pain, shortness of breath,  lower extremity edema, dizziness, presyncope, or syncope.  The patient is otherwise without complaint today.  The patient denies symptoms of fevers, chills, cough, or new SOB worrisome for COVID 19.  Past Medical History:  Diagnosis Date   Atrial fibrillation ([place])    Echocardiogram 05/20/11: EF 55-65%, moderate LAE, mild RAE;  failed DCCV 06/2011;  Flecainided started 07/2011;  DCCV 08/06/11   HLD (hyperlipidemia)    HTN (hypertension)    Mild obstructive sleep apnea    saw Dr. Gwenette Greet 3/13:  conservative mgmt for now    Past Surgical History:  Procedure Laterality Date   CARDIOVERSION  07/02/2011   Procedure: CARDIOVERSION;  Surgeon: Josue Hector, MD;  Location: [facility];  Service: Cardiovascular;  Laterality: N/A;   CARDIOVERSION  08/06/2011   Procedure: CARDIOVERSION;  Surgeon: Cristopher Peru, MD;  Location: [facility];  Service: Cardiovascular;   Laterality: N/A;   TONSILLECTOMY      Current Outpatient Medications  Medication Sig Dispense Refill   aspirin EC 81 MG tablet Take 1 tablet (81 mg total) by mouth daily. 150 tablet 2   cholecalciferol (VITAMIN D) 1000 units tablet Take 1,000 Units by mouth 2 (two) times daily.     Coenzyme Q10 (CO Q 10) 100 MG CAPS Take 100 mg by mouth every morning.       cyanocobalamin 500 MCG tablet Take 500 mcg by mouth every morning.       lisinopril (ZESTRIL) 5 MG tablet TAKE 1 TABLET(5 MG) BY MOUTH DAILY 90 tablet 3   metoprolol tartrate (LOPRESSOR) 50 MG tablet TAKE 1 AND 1/2 TABLETS BY MOUTH TWICE DAILY 270 tablet 1   nitroGLYCERIN (NITROSTAT) 0.4 MG SL tablet PLACE 1 TABLET UNDER THE TONGUE EVERY 5 MINUTES AS NEEDED FOR CHEST PAIN*MAX 3 TABLETS* 75 tablet 1   Omega-3 Fatty Acids (FISH OIL) 1200 MG CAPS Take 1 capsule by mouth 2 (two) times daily.      OVER THE COUNTER MEDICATION Place 2 drops into both eyes every morning. Murine Eye Drops     Probiotic Product (PROBIOTIC PO) Take 1 tablet by mouth every morning.       QUERCETIN PO Take 500 mg by mouth every morning.       rosuvastatin (CRESTOR) 40 MG tablet TAKE 1 TABLET BY MOUTH EVERY DAY 90 tablet 2   Saw Palmetto, Serenoa repens, (SAW PALMETTO BERRIES) 540 MG CAPS  Take 1 capsule by mouth 2 (two) times daily.       tadalafil (CIALIS) 20 MG tablet Take 1 tablet (20 mg total) by mouth daily as needed for erectile dysfunction. 10 tablet 3   vitamin C (ASCORBIC ACID) 500 MG tablet Take 500 mg by mouth 2 (two) times daily.       vitamin E 400 UNIT capsule Take 400 Units by mouth 2 (two) times daily.       No current facility-administered medications for this visit.     Allergies:   Patient has no known allergies.   Social History:  The patient  reports that he has never smoked. He has never used smokeless tobacco. He reports that he does not drink alcohol or use drugs.   Family History:  The patient's  family history includes  Cancer in his father; Cancer - Lung in his mother; Diabetes in his maternal grandmother; Hyperlipidemia in his brother; Hypertension in his brother; Raynaud syndrome in his mother.   ROS:  Please see the history of present illness.   All other systems are personally reviewed and negative.    Exam:    Vital Signs:  BP - 101/68, p- 55   Labs/Other Tests and Data Reviewed:    Recent Labs: No results found for requested labs within last 8760 hours.   Wt Readings from Last 3 Encounters:  11/19/17 188 lb (85.3 kg)  11/19/16 203 lb (92.1 kg)  11/06/15 213 lb (96.6 kg)     Other studies personally reviewed: The tracings reveal atrial fib at 55/min.     ASSESSMENT & PLAN:    1.  Atrial fib - his rates are well controlled. He will continue his current meds. He is CHADSVASC is 2 2. HTN - his bp is good. He will continue his current meds.  3. Dyslipidemia - he will continue his current meds.    COVID 19 screen The patient denies symptoms of COVID 19 at this time.  The importance of social distancing was discussed today.  Follow-up:  6 months Next remote: n/a  Current medicines are reviewed at length with the patient today.   The patient does not have concerns regarding his medicines.  The following changes were made today:  none  Labs/ tests ordered today include:  No orders of the defined types were placed in this encounter.  Patient Risk:  after full review of this patients clinical status, I feel that they are at moderate risk at this time.  Today, I have spent 15 minutes with the patient with telehealth technology discussing all of the above .    Signed, Lewayne BuntingGregg Zakyra Kukuk, MD  11/30/2018 9:47 AM     [facility] [address] [place] [address] [phone] (office) [phone] (fax)

## 2019-01-12 ENCOUNTER — Other Ambulatory Visit: Payer: Self-pay | Admitting: Internal Medicine

## 2019-01-12 DIAGNOSIS — I10 Essential (primary) hypertension: Secondary | ICD-10-CM

## 2019-07-06 ENCOUNTER — Other Ambulatory Visit: Payer: Self-pay | Admitting: Internal Medicine

## 2019-07-06 DIAGNOSIS — I10 Essential (primary) hypertension: Secondary | ICD-10-CM

## 2019-07-12 ENCOUNTER — Encounter: Payer: Self-pay | Admitting: Internal Medicine

## 2019-07-12 ENCOUNTER — Ambulatory Visit: Payer: Federal, State, Local not specified - PPO | Admitting: Internal Medicine

## 2019-07-12 ENCOUNTER — Other Ambulatory Visit: Payer: Self-pay

## 2019-07-12 VITALS — BP 142/90 | HR 83 | Ht 70.0 in | Wt 194.2 lb

## 2019-07-12 DIAGNOSIS — I10 Essential (primary) hypertension: Secondary | ICD-10-CM

## 2019-07-12 DIAGNOSIS — I4891 Unspecified atrial fibrillation: Secondary | ICD-10-CM | POA: Diagnosis not present

## 2019-07-12 DIAGNOSIS — E785 Hyperlipidemia, unspecified: Secondary | ICD-10-CM

## 2019-07-12 NOTE — Patient Instructions (Addendum)
Medication Instructions:  Your physician recommends that you continue on your current medications as directed. Please refer to the Current Medication list given to you today.  Labwork: None ordered.  Testing/Procedures: None ordered.  Follow-Up: Your physician wants you to follow-up in: September 2022 with Dr. Ladona Ridgel.   You will receive a reminder letter in the mail two months in advance. If you don't receive a letter, please call our office to schedule the follow-up appointment.   Any Other Special Instructions Will Be Listed Below (If Applicable).  If you need a refill on your cardiac medications before your next appointment, please call your pharmacy.

## 2019-07-12 NOTE — Progress Notes (Signed)
HPI [name] returns today for followup of his atrial fib. He is a pleasant 64 yo man with a h/o permanent atrial fibrillation who has been maintained on rate control. He has HTN. His CHADSVASC score is 1. He remains active playing tennis and pickleball instead of racket ball. He denies chest pain, sob, or syncope.  No Known Allergies   Current Outpatient Medications  Medication Sig Dispense Refill  . aspirin EC 81 MG tablet Take 1 tablet (81 mg total) by mouth daily. 150 tablet 2  . cholecalciferol (VITAMIN D) 1000 units tablet Take 1,000 Units by mouth 2 (two) times daily.    . Coenzyme Q10 (CO Q 10) 100 MG CAPS Take 100 mg by mouth every morning.      . cyanocobalamin 500 MCG tablet Take 500 mcg by mouth every morning.      Marland Kitchen lisinopril (ZESTRIL) 5 MG tablet TAKE 1 TABLET(5 MG) BY MOUTH DAILY 90 tablet 3  . metoprolol tartrate (LOPRESSOR) 50 MG tablet TAKE 1 AND 1/2 TABLETS BY MOUTH TWICE DAILY 270 tablet 2  . nitroGLYCERIN (NITROSTAT) 0.4 MG SL tablet PLACE 1 TABLET UNDER THE TONGUE EVERY 5 MINUTES AS NEEDED FOR CHEST PAIN*MAX 3 TABLETS* 75 tablet 1  . Omega-3 Fatty Acids (FISH OIL) 1200 MG CAPS Take 1 capsule by mouth 2 (two) times daily.     Marland Kitchen OVER THE COUNTER MEDICATION Place 2 drops into both eyes every morning. Murine Eye Drops    . Probiotic Product (PROBIOTIC PO) Take 1 tablet by mouth every morning.      Marland Kitchen QUERCETIN PO Take 500 mg by mouth every morning.      . rosuvastatin (CRESTOR) 40 MG tablet TAKE 1 TABLET BY MOUTH EVERY DAY 90 tablet 2  . tadalafil (CIALIS) 20 MG tablet Take 1 tablet (20 mg total) by mouth daily as needed for erectile dysfunction. 10 tablet 3  . vitamin C (ASCORBIC ACID) 500 MG tablet Take 500 mg by mouth 2 (two) times daily.      . vitamin E 400 UNIT capsule Take 400 Units by mouth 2 (two) times daily.       No current facility-administered medications for this visit.     Past Medical History:  Diagnosis Date  . Atrial fibrillation ([place])    Echocardiogram 05/20/11: EF 55-65%, moderate LAE, mild RAE;  failed DCCV 06/2011;  Flecainided started 07/2011;  DCCV 08/06/11  . HLD (hyperlipidemia)   . HTN (hypertension)   . Mild obstructive sleep apnea    saw Dr. Gwenette Greet 3/13:  conservative mgmt for now    ROS:   All systems reviewed and negative except as noted in the HPI.   Past Surgical History:  Procedure Laterality Date  . CARDIOVERSION  07/02/2011   Procedure: CARDIOVERSION;  Surgeon: Josue Hector, MD;  Location: [facility];  Service: Cardiovascular;  Laterality: N/A;  . CARDIOVERSION  08/06/2011   Procedure: CARDIOVERSION;  Surgeon: Cristopher Peru, MD;  Location: [facility] OR;  Service: Cardiovascular;  Laterality: N/A;  . TONSILLECTOMY       Family History  Problem Relation Age of Onset  . Cancer - Lung Mother   . Raynaud syndrome Mother   . Hypertension Brother   . Hyperlipidemia Brother   . Diabetes Maternal Grandmother   . Cancer Father      Social History   Socioeconomic History  . Marital status: Divorced    Spouse name: Not on file  . Number of children: Not  on file  . Years of education: Not on file  . Highest education level: Not on file  Occupational History  . 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 Smoking status: Never Smoker  . Smokeless tobacco: Never Used  Substance and Sexual Activity  . Alcohol use: No    Alcohol/week: 0.0 standard drinks    Comment: occassionally  . Drug use: No  . Sexual activity: Not on file  Other Topics Concern  . Not on file  Social History Narrative   Divorced. Now single. Lives with [facility]. Dating seriously; duration 1.5 years.     Social Determinants of Health   Financial Resource Strain:   . Difficulty of Paying Living Expenses: Not on file  Food Insecurity:   . Worried About Programme researcher, broadcasting/film/video in the Last Year: Not on file  . Ran Out of Food in the Last Year: Not on file  Transportation Needs:   . Lack of  Transportation (Medical): Not on file  . Lack of Transportation (Non-Medical): Not on file  Physical Activity:   . Days of Exercise per Week: Not on file  . Minutes of Exercise per Session: Not on file  Stress:   . Feeling of Stress : Not on file  Social Connections:   . Frequency of Communication with Friends and Family: Not on file  . Frequency of Social Gatherings with Friends and Family: Not on file  . Attends Religious Services: Not on file  . Active Member of Clubs or Organizations: Not on file  . Attends Banker Meetings: Not on file  . Marital Status: Not on file  Intimate Partner Violence:   . Fear of Current or Ex-Partner: Not on file  . Emotionally Abused: Not on file  . Physically Abused: Not on file  . Sexually Abused: Not on file     BP (!) 142/90   Pulse 83   Ht 5\' 10"  (1.778 m)   Wt 194 lb 3.2 oz (88.1 kg)   SpO2 98%   BMI 27.86 kg/m   Physical Exam:  Well appearing NAD HEENT: Unremarkable Neck:  No JVD, no thyromegally Lymphatics:  No adenopathy Back:  No CVA tenderness Lungs:  Clear with no wheezes HEART:  IRegular rate rhythm, no murmurs, no rubs, no clicks Abd:  soft, positive bowel sounds, no organomegally, no rebound, no guarding Ext:  2 plus pulses, no edema, no cyanosis, no clubbing Skin:  No rashes no nodules Neuro:  CN II through XII intact, motor grossly intact  EKG - atrial fib with a controlled VR  Assess/Plan: 1. Permanent atrial fib - he remains asymptomatic. He will continue his current meds. We will discuss initiation of systemic anti-coagulation when I see him back in 35months.  2. HTN -his bp is minimally elevated.  3. Dyslipidemia - he will continue crestor for primary prevention.  13month.D.

## 2019-07-30 ENCOUNTER — Other Ambulatory Visit: Payer: Self-pay | Admitting: Internal Medicine

## 2019-09-30 ENCOUNTER — Other Ambulatory Visit: Payer: Self-pay | Admitting: Internal Medicine

## 2019-09-30 DIAGNOSIS — I10 Essential (primary) hypertension: Secondary | ICD-10-CM

## 2020-03-22 ENCOUNTER — Other Ambulatory Visit: Payer: Self-pay | Admitting: Internal Medicine

## 2020-03-22 DIAGNOSIS — I10 Essential (primary) hypertension: Secondary | ICD-10-CM

## 2020-03-31 ENCOUNTER — Other Ambulatory Visit: Payer: Self-pay | Admitting: Internal Medicine

## 2020-09-27 ENCOUNTER — Other Ambulatory Visit: Payer: Self-pay | Admitting: Internal Medicine

## 2020-09-27 DIAGNOSIS — I10 Essential (primary) hypertension: Secondary | ICD-10-CM

## 2020-11-13 DIAGNOSIS — L2084 Intrinsic (allergic) eczema: Secondary | ICD-10-CM | POA: Diagnosis not present

## 2020-11-13 DIAGNOSIS — L309 Dermatitis, unspecified: Secondary | ICD-10-CM | POA: Diagnosis not present

## 2020-12-10 ENCOUNTER — Other Ambulatory Visit: Payer: Self-pay | Admitting: *Deleted

## 2020-12-10 DIAGNOSIS — I10 Essential (primary) hypertension: Secondary | ICD-10-CM

## 2020-12-10 MED ORDER — METOPROLOL TARTRATE 50 MG PO TABS: 75.0000 mg | ORAL_TABLET | Freq: Two times a day (BID) | ORAL | 2 refills | Status: DC

## 2021-03-08 ENCOUNTER — Other Ambulatory Visit: Payer: Self-pay

## 2021-03-08 ENCOUNTER — Ambulatory Visit: Payer: Federal, State, Local not specified - PPO | Admitting: Internal Medicine

## 2021-03-08 VITALS — BP 158/86 | HR 86 | Ht 70.0 in | Wt 196.0 lb

## 2021-03-08 DIAGNOSIS — E785 Hyperlipidemia, unspecified: Secondary | ICD-10-CM | POA: Diagnosis not present

## 2021-03-08 DIAGNOSIS — I10 Essential (primary) hypertension: Secondary | ICD-10-CM | POA: Diagnosis not present

## 2021-03-08 DIAGNOSIS — I4891 Unspecified atrial fibrillation: Secondary | ICD-10-CM

## 2021-03-08 MED ORDER — APIXABAN 5 MG PO TABS: 5.0000 mg | ORAL_TABLET | Freq: Two times a day (BID) | ORAL | 11 refills | Status: DC

## 2021-03-08 MED ORDER — METOPROLOL SUCCINATE ER 50 MG PO TB24: 50.0000 mg | ORAL_TABLET | Freq: Every day | ORAL | 3 refills | Status: DC

## 2021-03-08 NOTE — Progress Notes (Signed)
HPI [name] returns today for followup of his atrial fib. He is a pleasant 65 yo man with a h/o permanent atrial fibrillation who has been maintained on rate control. He has HTN. His CHADSVASC score is 1 but he will be turning 65 in a couple of weeks. He remains active playing tennis and pickleball instead of racket ball. He denies chest pain, sob, or syncope.   No Known Allergies   Current Outpatient Medications  Medication Sig Dispense Refill   apixaban (ELIQUIS) 5 MG TABS tablet Take 1 tablet (5 mg total) by mouth 2 (two) times daily. 60 tablet 11   APPLE CIDER VINEGAR PO Take 1 Dose by mouth daily.     cholecalciferol (VITAMIN D) 1000 units tablet Take 1,000 Units by mouth 2 (two) times daily.     Cod Liver Oil OIL Take 0.5 oz by mouth daily.     Coenzyme Q10 (CO Q 10) 100 MG CAPS Take 100 mg by mouth every morning.       cyanocobalamin 500 MCG tablet Take 500 mcg by mouth every morning.       Garlic 100 MG TABS Take 1 capsule by mouth daily.     lisinopril (ZESTRIL) 5 MG tablet Take 1 tablet (5 mg total) by mouth daily. Please make yearly appt with Dr. Ladona Ridgel for September 2022 for future refills. Thank you 1st attempt 90 tablet 1   Magnesium 250 MG TABS Take 1 tablet by mouth daily.     melatonin 3 MG TABS tablet Take 3 mg by mouth at bedtime.     metoprolol succinate (TOPROL-XL) 50 MG 24 hr tablet Take 1 tablet (50 mg total) by mouth daily. Take with or immediately following a meal. 90 tablet 3   nitroGLYCERIN (NITROSTAT) 0.4 MG SL tablet PLACE 1 TABLET UNDER THE TONGUE EVERY 5 MINUTES AS NEEDED FOR CHEST PAIN*MAX 3 TABLETS* 75 tablet 1   Omega-3 Fatty Acids (FISH OIL) 1200 MG CAPS Take 1 capsule by mouth 2 (two) times daily.      OVER THE COUNTER MEDICATION Place 2 drops into both eyes every morning. Murine Eye Drops     Probiotic Product (PROBIOTIC PO) Take 1 tablet by mouth every morning.       QUERCETIN PO Take 500 mg by mouth every morning.       rosuvastatin (CRESTOR)  40 MG tablet TAKE 1 TABLET BY MOUTH EVERY DAY 90 tablet 3   tadalafil (CIALIS) 20 MG tablet Take 1 tablet (20 mg total) by mouth daily as needed for erectile dysfunction. 10 tablet 3   Taurine 1000 MG CAPS Take 1 tablet by mouth daily.     Turmeric 500 MG CAPS Take 1 tablet by mouth in the morning and at bedtime.     vitamin C (ASCORBIC ACID) 500 MG tablet Take 500 mg by mouth 2 (two) times daily.       vitamin E 400 UNIT capsule Take 400 Units by mouth 2 (two) times daily.       No current facility-administered medications for this visit.     Past Medical History:  Diagnosis Date   Atrial fibrillation (HCC)    Echocardiogram 05/20/11: EF 55-65%, moderate LAE, mild RAE;  failed DCCV 06/2011;  Flecainided started 07/2011;  DCCV 08/06/11   HLD (hyperlipidemia)    HTN (hypertension)    Mild obstructive sleep apnea    saw Dr. Shelle Iron 3/13:  conservative mgmt for now    ROS:  All systems reviewed and negative except as noted in the HPI.   Past Surgical History:  Procedure Laterality Date   CARDIOVERSION  07/02/2011   Procedure: CARDIOVERSION;  Surgeon: Wendall Stade, MD;  Location: [facility] OR;  Service: Cardiovascular;  Laterality: N/A;   CARDIOVERSION  08/06/2011   Procedure: CARDIOVERSION;  Surgeon: [name], MD;  Location: [facility] OR;  Service: Cardiovascular;  Laterality: N/A;   TONSILLECTOMY       Family History  Problem Relation Age of Onset   Cancer - Lung Mother    Raynaud syndrome Mother    Hypertension Brother    Hyperlipidemia Brother    Diabetes Maternal Grandmother    Cancer Father      Social History   Socioeconomic History   Marital status: Divorced    Spouse name: Not on file   Number of children: Not on file   Years of education: Not on file   Highest education level: Not on file  Occupational History   Occupation: retired    Comment: prev worked as an Pharmacist, hospital. w/ postal service  Tobacco Use   Smoking status: Never   Smokeless tobacco: Never  Substance and  Sexual Activity   Alcohol use: No    Alcohol/week: 0.0 standard drinks    Comment: occassionally   Drug use: No   Sexual activity: Not on file  Other Topics Concern   Not on file  Social History Narrative   Divorced. Now single. Lives with [facility]. Dating seriously; duration 1.5 years.     Social Determinants of Health   Financial Resource Strain: Not on file  Food Insecurity: Not on file  Transportation Needs: Not on file  Physical Activity: Not on file  Stress: Not on file  Social Connections: Not on file  Intimate Partner Violence: Not on file     BP (!) 158/86   Pulse 86   Ht 5\' 10"  (1.778 m)   Wt 196 lb (88.9 kg)   SpO2 99%   BMI 28.12 kg/m   Physical Exam:  Well appearing NAD HEENT: Unremarkable Neck:  No JVD, no thyromegally Lymphatics:  No adenopathy Back:  No CVA tenderness Lungs:  Clear with no wheezes HEART:  Regular rate rhythm, no murmurs, no rubs, no clicks Abd:  soft, positive bowel sounds, no organomegally, no rebound, no guarding Ext:  2 plus pulses, no edema, no cyanosis, no clubbing Skin:  No rashes no nodules Neuro:  CN II through XII intact, motor grossly intact  EKG - atrial fib with a controlled VR  Assess/Plan:  1. Permanent atrial fib - he remains asymptomatic. He will continue his current meds. We have discussed initiation of systemic anti-coagulation. I have recommended starting Eliquis when he turns 65. 2. HTN -his bp is elevated but runs better at home. He will undergo watchful waiting. .  3. Dyslipidemia - he will continue crestor.   .D.

## 2021-03-08 NOTE — Patient Instructions (Addendum)
Medication Instructions:  Your physician has recommended you make the following change in your medication:    START taking Eliquis 5 mg-  Take one tablet by mouth twice a day  2.    STOP ASPIRIN  3.  STOP metoprolol tartrate  4.  START taking metoprolol succinate 50 mg-  Take one tablet by mouth daily  Labwork: None ordered.  Testing/Procedures: None ordered.  Follow-Up:  You will have a nurse visit in 3 weeks:  March 26, 2021 at 9:00 am at the [facility] office  Your physician wants you to follow-up in: one year with [name], MD or one of the following Advanced Practice Providers on your designated Care Team:   Francis Dowse, [place] Casimiro Needle "Mardelle Matte" Lanna Poche, [place]   Any Other Special Instructions Will Be Listed Below (If Applicable).  If you need a refill on your cardiac medications before your next appointment, please call your pharmacy.    Apixaban Tablets What is this medication? APIXABAN (a PIX a ban) prevents or treats blood clots. It is also used to lower the risk of stroke in people with AFib (atrial fibrillation). It belongs to a group of medications called blood thinners. This medicine may be used for other purposes; ask your health care provider or pharmacist if you have questions. COMMON BRAND NAME(S): Eliquis What should I tell my care team before I take this medication? They need to know if you have any of these conditions: Antiphospholipid antibody syndrome Bleeding disorder History of bleeding in the brain History of blood clots History of stomach bleeding Kidney disease Liver disease Mechanical heart valve Spinal surgery An unusual or allergic reaction to apixaban, other medications, foods, dyes, or preservatives Pregnant or trying to get pregnant Breast-feeding How should I use this medication? Take this medication by mouth. For your therapy to work as well as possible, take each dose exactly as prescribed on the prescription label. Do not skip  doses. Skipping doses or stopping this medication can increase your risk of a blood clot or stroke. Keep taking this medication unless your care team tells you to stop. Take it as directed on the prescription label at the same time every day. You can take it with or without food. If it upsets your stomach, take it with food. A special MedGuide will be given to you by the pharmacist with each prescription and refill. Be sure to read this information carefully each time. Talk to your care team about the use of this medication in children. Special care may be needed. Overdosage: If you think you have taken too much of this medicine contact a poison control center or emergency room at once. NOTE: This medicine is only for you. Do not share this medicine with others. What if I miss a dose? If you miss a dose, take it as soon as you can. If it is almost time for your next dose, take only that dose. Do not take double or extra doses. What may interact with this medication? This medication may interact with the following: Aspirin and aspirin-like medications Certain medications for fungal infections like itraconazole and ketoconazole Certain medications for seizures like carbamazepine and phenytoin Certain medications for blood clots like enoxaparin, dalteparin, heparin, and warfarin Clarithromycin NSAIDs, medications for pain and inflammation, like ibuprofen or naproxen Rifampin Ritonavir St. John's wort This list may not describe all possible interactions. Give your health care provider a list of all the medicines, herbs, non-prescription drugs, or dietary supplements you use. Also tell them  if you smoke, drink alcohol, or use illegal drugs. Some items may interact with your medicine. What should I watch for while using this medication? Visit your healthcare professional for regular checks on your progress. You may need blood work done while you are taking this medication. Your condition will be  monitored carefully while you are receiving this medication. It is important not to miss any appointments. Avoid sports and activities that might cause injury while you are using this medication. Severe falls or injuries can cause unseen bleeding. Be careful when using sharp tools or knives. Consider using an Neurosurgeon. Take special care brushing or flossing your teeth. Report any injuries, bruising, or red spots on the skin to your healthcare professional. If you are going to need surgery or other procedure, tell your healthcare professional that you are taking this medication. Wear a medical ID bracelet or chain. Carry a card that describes your disease and details of your medication and dosage times. What side effects may I notice from receiving this medication? Side effects that you should report to your care team as soon as possible: Allergic reactions-skin rash, itching, hives, swelling of the face, lips, tongue, or throat Bleeding-bloody or black, tar-like stools, vomiting blood or brown material that looks like coffee grounds, red or dark brown urine, small red or purple spots on the skin, unusual bruising or bleeding Bleeding in the brain-severe headache, stiff neck, confusion, dizziness, change in vision, numbness or weakness of the face, arm, or leg, trouble speaking, trouble walking, vomiting Heavy periods This list may not describe all possible side effects. Call your doctor for medical advice about side effects. You may report side effects to FDA at 1-800-FDA-1088. Where should I keep my medication? Keep out of the reach of children and pets. Store at room temperature between 20 and 25 degrees C (68 and 77 degrees F). Get rid of any unused medication after the expiration date. To get rid of medications that are no longer needed or expired: Take the medication to a medication take-back program. Check with your pharmacy or law enforcement to find a location. If you cannot return the  medication, check the label or package insert to see if the medication should be thrown out in the garbage or flushed down the toilet. If you are not sure, ask your care team. If it is safe to put in the trash, empty the medication out of the container. Mix the medication with cat litter, dirt, coffee grounds, or other unwanted substance. Seal the mixture in a bag or container. Put it in the trash. NOTE: This sheet is a summary. It may not cover all possible information. If you have questions about this medicine, talk to your doctor, pharmacist, or health care provider.  2022 Elsevier/Gold Standard (2020-06-29 12:37:23)

## 2021-03-26 ENCOUNTER — Ambulatory Visit: Payer: Federal, State, Local not specified - PPO

## 2021-03-26 ENCOUNTER — Other Ambulatory Visit: Payer: Self-pay | Admitting: *Deleted

## 2021-03-26 ENCOUNTER — Other Ambulatory Visit: Payer: Self-pay

## 2021-03-26 VITALS — BP 158/90 | HR 76 | Wt 198.8 lb

## 2021-03-26 DIAGNOSIS — I4891 Unspecified atrial fibrillation: Secondary | ICD-10-CM | POA: Diagnosis not present

## 2021-03-26 DIAGNOSIS — I10 Essential (primary) hypertension: Secondary | ICD-10-CM

## 2021-03-26 MED ORDER — LISINOPRIL 5 MG PO TABS: 5.0000 mg | ORAL_TABLET | Freq: Every day | ORAL | 3 refills | Status: DC

## 2021-03-26 NOTE — Patient Instructions (Signed)
Medication Instructions:  Your physician recommends that you continue on your current medications as directed. Please refer to the Current Medication list given to you today.  *If you need a refill on your cardiac medications before your next appointment, please call your pharmacy*   Lab Work: None ordered.  If you have labs (blood work) drawn today and your tests are completely normal, you will receive your results only by: . MyChart Message (if you have MyChart) OR . A paper copy in the mail If you have any lab test that is abnormal or we need to change your treatment, we will call you to review the results.   Testing/Procedures: None ordered.    Follow-Up: At [facility], you and your health needs are our priority.  As part of our continuing mission to provide you with exceptional heart care, we have created designated Provider Care Teams.  These Care Teams include your primary Cardiologist (physician) and Advanced Practice Providers (APPs -  Physician Assistants and Nurse Practitioners) who all work together to provide you with the care you need, when you need it.  We recommend signing up for the patient portal called "MyChart".  Sign up information is provided on this After Visit Summary.  MyChart is used to connect with patients for Virtual Visits (Telemedicine).  Patients are able to view lab/test results, encounter notes, upcoming appointments, etc.  Non-urgent messages can be sent to your provider as well.   To learn more about what you can do with MyChart, go to [email or web address].    Your next appointment:   As scheduled  

## 2021-03-26 NOTE — Progress Notes (Signed)
Pt presents to day at the request of Dr [name] for EKG due to addition of Metoprolol for rate control d/t history of Afib. Pt reports he is doing well and tolerating medication without any problems.  He has no additional complaints today. EKG completed and taken to Dr Ladona Ridgel for review who states EKG shows Afib with controlled ventricular rate. Continue current medications and follow up as scheduled.  Call for questions or concerns. Pt verbalizes understanding and agrees with current plan.

## 2021-04-24 ENCOUNTER — Other Ambulatory Visit: Payer: Self-pay

## 2021-04-24 MED ORDER — ROSUVASTATIN CALCIUM 40 MG PO TABS: 40.0000 mg | ORAL_TABLET | Freq: Every day | ORAL | 3 refills | Status: DC

## 2021-07-30 DIAGNOSIS — H5213 Myopia, bilateral: Secondary | ICD-10-CM | POA: Diagnosis not present

## 2021-07-30 DIAGNOSIS — H353230 Exudative age-related macular degeneration, bilateral, stage unspecified: Secondary | ICD-10-CM | POA: Diagnosis not present

## 2021-07-30 DIAGNOSIS — H35033 Hypertensive retinopathy, bilateral: Secondary | ICD-10-CM | POA: Diagnosis not present

## 2021-07-30 DIAGNOSIS — H5203 Hypermetropia, bilateral: Secondary | ICD-10-CM | POA: Diagnosis not present

## 2021-07-30 DIAGNOSIS — H2513 Age-related nuclear cataract, bilateral: Secondary | ICD-10-CM | POA: Diagnosis not present

## 2021-10-01 ENCOUNTER — Other Ambulatory Visit: Payer: Self-pay | Admitting: Internal Medicine

## 2021-10-01 DIAGNOSIS — I10 Essential (primary) hypertension: Secondary | ICD-10-CM

## 2021-10-01 NOTE — Telephone Encounter (Signed)
Pt called back and stated he just received a 90 day supply from mail order and he did schedule an appt for labs on 10/10/2021. He is aware will send in the refill after labs are resulted and he verbalized understanding.  ?

## 2021-10-01 NOTE — Telephone Encounter (Signed)
Prescription refill request for Eliquis received. ?Indication:Afib  ?Last office visit:03/08/21 Charles Mills)  ?Scr: 0.91 (02/15/15)  ?Age: 66 ?Weight: 90.2kg ? ?Labs overdue. Called pt, no answer. Left message. ? ? ?

## 2021-10-03 ENCOUNTER — Other Ambulatory Visit: Payer: Self-pay | Admitting: Internal Medicine

## 2021-10-03 DIAGNOSIS — I10 Essential (primary) hypertension: Secondary | ICD-10-CM

## 2021-10-09 ENCOUNTER — Telehealth: Payer: Self-pay | Admitting: Internal Medicine

## 2021-10-09 NOTE — Telephone Encounter (Signed)
Patient is coming in for lab tomorrow, he wants to know if he should take his Eliquis tomorrow.  ?

## 2021-10-09 NOTE — Telephone Encounter (Signed)
Returned call to Pt. ? ?Advised to continue Eliquis.  Do not hold for lab work. ? ?Pt indicates understanding. ?

## 2021-10-10 ENCOUNTER — Other Ambulatory Visit: Payer: Medicare Other

## 2021-10-10 DIAGNOSIS — I10 Essential (primary) hypertension: Secondary | ICD-10-CM | POA: Diagnosis not present

## 2021-10-10 LAB — CBC
Hematocrit: 42.5 % (ref 37.5–51.0)
Hemoglobin: 14.1 g/dL (ref 13.0–17.7)
MCH: 30.2 pg (ref 26.6–33.0)
MCHC: 33.2 g/dL (ref 31.5–35.7)
MCV: 91 fL (ref 79–97)
Platelets: 230 10*3/uL (ref 150–450)
RBC: 4.67 x10E6/uL (ref 4.14–5.80)
RDW: 12.7 % (ref 11.6–15.4)
WBC: 6.6 10*3/uL (ref 3.4–10.8)

## 2021-10-10 LAB — BASIC METABOLIC PANEL
BUN/Creatinine Ratio: 16 (ref 10–24)
BUN: 15 mg/dL (ref 8–27)
CO2: 24 mmol/L (ref 20–29)
Calcium: 9.7 mg/dL (ref 8.6–10.2)
Chloride: 100 mmol/L (ref 96–106)
Creatinine, Ser: 0.93 mg/dL (ref 0.76–1.27)
Glucose: 96 mg/dL (ref 70–99)
Potassium: 4.9 mmol/L (ref 3.5–5.2)
Sodium: 140 mmol/L (ref 134–144)
eGFR: 91 mL/min/{1.73_m2} (ref >59)

## 2021-10-11 NOTE — Telephone Encounter (Signed)
Prescription refill request for Eliquis received. ?Indication:  Afib  ?Last office visit: 03/08/21 Ladona Ridgel)  ?Scr: 0.93 (10/10/21)  ?Age: 66 ?Weight: 90.2kg ? ? ?Appropriate dose and refill sent to requested pharmacy.  ?

## 2022-01-20 ENCOUNTER — Other Ambulatory Visit: Payer: Self-pay | Admitting: Internal Medicine

## 2022-01-20 DIAGNOSIS — I10 Essential (primary) hypertension: Secondary | ICD-10-CM

## 2022-01-25 ENCOUNTER — Other Ambulatory Visit: Payer: Self-pay | Admitting: Internal Medicine

## 2022-01-25 DIAGNOSIS — I10 Essential (primary) hypertension: Secondary | ICD-10-CM

## 2022-01-27 ENCOUNTER — Encounter: Payer: Self-pay | Admitting: Internal Medicine

## 2022-01-27 DIAGNOSIS — L2084 Intrinsic (allergic) eczema: Secondary | ICD-10-CM | POA: Insufficient documentation

## 2022-01-27 DIAGNOSIS — I10 Essential (primary) hypertension: Secondary | ICD-10-CM

## 2022-01-27 MED ORDER — METOPROLOL SUCCINATE ER 50 MG PO TB24: ORAL_TABLET | ORAL | 0 refills | Status: DC

## 2022-01-27 MED ORDER — LISINOPRIL 5 MG PO TABS: 5.0000 mg | ORAL_TABLET | Freq: Every day | ORAL | 0 refills | Status: DC

## 2022-02-24 ENCOUNTER — Other Ambulatory Visit: Payer: Self-pay | Admitting: Internal Medicine

## 2022-03-20 ENCOUNTER — Ambulatory Visit: Payer: Medicare Other | Attending: Internal Medicine | Admitting: Internal Medicine

## 2022-03-20 VITALS — BP 132/78 | HR 106 | Ht 70.0 in | Wt 188.0 lb

## 2022-03-20 DIAGNOSIS — I4891 Unspecified atrial fibrillation: Secondary | ICD-10-CM

## 2022-03-20 DIAGNOSIS — I10 Essential (primary) hypertension: Secondary | ICD-10-CM

## 2022-03-20 NOTE — Patient Instructions (Signed)

## 2022-03-20 NOTE — Progress Notes (Signed)
HPI [name] returns today for followup. He is a pleasant 66 yo man with a h/o HTN, and chronic atrial fib. He remains active playing tennis and pickleball. He has not had chest pain or sob. No syncope. No edema. He has lost 18 lbs.on a keto diet.  No Known Allergies   Current Outpatient Medications  Medication Sig Dispense Refill   apixaban (ELIQUIS) 5 MG TABS tablet TAKE 1 TABLET(5 MG) BY MOUTH TWICE DAILY 180 tablet 1   APPLE CIDER VINEGAR PO Take 1 Dose by mouth daily.     cholecalciferol (VITAMIN D) 1000 units tablet Take 1,000 Units by mouth 2 (two) times daily.     Cod Liver Oil OIL Take 0.5 oz by mouth daily.     Coenzyme Q10 (CO Q 10) 100 MG CAPS Take 100 mg by mouth every morning.       cyanocobalamin 500 MCG tablet Take 500 mcg by mouth every morning.       Garlic 100 MG TABS Take 1 capsule by mouth daily.     lisinopril (ZESTRIL) 5 MG tablet Take 1 tablet (5 mg total) by mouth daily. 90 tablet 0   Magnesium 250 MG TABS Take 1 tablet by mouth daily.     melatonin 3 MG TABS tablet Take 3 mg by mouth at bedtime.     metoprolol succinate (TOPROL-XL) 50 MG 24 hr tablet Take 1 tablet by mouth daily with or immediately following a meal. 90 tablet 0   nitroGLYCERIN (NITROSTAT) 0.4 MG SL tablet PLACE 1 TABLET UNDER THE TONGUE EVERY 5 MINUTES AS NEEDED FOR CHEST PAIN*MAX 3 TABLETS* 75 tablet 1   Omega-3 Fatty Acids (FISH OIL) 1200 MG CAPS Take 1 capsule by mouth 2 (two) times daily.      OVER THE COUNTER MEDICATION Place 2 drops into both eyes every morning. Murine Eye Drops     Probiotic Product (PROBIOTIC PO) Take 1 tablet by mouth every morning.       QUERCETIN PO Take 500 mg by mouth every morning.       rosuvastatin (CRESTOR) 40 MG tablet TAKE 1 TABLET BY MOUTH DAILY 30 tablet 0   tadalafil (CIALIS) 20 MG tablet Take 1 tablet (20 mg total) by mouth daily as needed for erectile dysfunction. 10 tablet 3   Turmeric 500 MG CAPS Take 1 tablet by mouth in the morning and at  bedtime.     vitamin C (ASCORBIC ACID) 500 MG tablet Take 500 mg by mouth 2 (two) times daily.       vitamin E 400 UNIT capsule Take 400 Units by mouth 2 (two) times daily.       Taurine 1000 MG CAPS Take 1 tablet by mouth daily.     No current facility-administered medications for this visit.     Past Medical History:  Diagnosis Date   Atrial fibrillation (HCC)    Echocardiogram 05/20/11: EF 55-65%, moderate LAE, mild RAE;  failed DCCV 06/2011;  Flecainided started 07/2011;  DCCV 08/06/11   HLD (hyperlipidemia)    HTN (hypertension)    Mild obstructive sleep apnea    saw Dr. Shelle Iron 3/13:  conservative mgmt for now    ROS:   All systems reviewed and negative except as noted in the HPI.   Past Surgical History:  Procedure Laterality Date   CARDIOVERSION  07/02/2011   Procedure: CARDIOVERSION;  Surgeon: Wendall Stade, MD;  Location: [facility] OR;  Service: Cardiovascular;  Laterality: N/A;  CARDIOVERSION  08/06/2011   Procedure: CARDIOVERSION;  Surgeon: Cristopher Peru, MD;  Location: [facility] OR;  Service: Cardiovascular;  Laterality: N/A;   TONSILLECTOMY       Family History  Problem Relation Age of Onset   Cancer - Lung Mother    Raynaud syndrome Mother    Hypertension Brother    Hyperlipidemia Brother    Diabetes Maternal Grandmother    Cancer Father      Social History   Socioeconomic History   Marital status: Divorced    Spouse name: Not on file   Number of children: Not on file   Years of education: Not on file   Highest education level: Not on file  Occupational History   Occupation: retired    Comment: 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 Now single. Lives  with [facility]. Dating seriously; duration 1.5 years.     Social Determinants of Health   Financial Resource Strain: Not on file  Food Insecurity: Not on file  Transportation Needs: Not on file  Physical Activity: Not on file  Stress: Not on file  Social Connections: Not on file  Intimate Partner Violence: Not on file     BP 132/78   Pulse (!) 106   Ht 5\' 10"  (1.778 m)   Wt 188 lb (85.3 kg)   SpO2 95%   BMI 26.98 kg/m   Physical Exam:  Well appearing NAD HEENT: Unremarkable Neck:  No JVD, no thyromegally Lymphatics:  No adenopathy Back:  No CVA tenderness Lungs:  Clear HEART:  IRegular rate rhythm, no murmurs, no rubs, no clicks Abd:  soft, positive bowel sounds, no organomegally, no rebound, no guarding Ext:  2 plus pulses, no edema, no cyanosis, no clubbing Skin:  No rashes no nodules Neuro:  CN II through XII intact, motor grossly intact  EKG - atrial fib with a RVR  Assess/Plan:  Atrial fib - he was in a hurry today and his VR is increased but he has not had any palpitations and notes that his atrial fib is controlled by pulse ox and fit bit. He will continue his current meds. Coags - he has not had any bleeding now a year into taking systemic anti-coag. HTN -his bp is well controlled. No change.  Charles Overlie Vasiliy Mccarry,MD

## 2022-03-21 ENCOUNTER — Other Ambulatory Visit: Payer: Self-pay

## 2022-03-21 DIAGNOSIS — I10 Essential (primary) hypertension: Secondary | ICD-10-CM

## 2022-03-21 MED ORDER — LISINOPRIL 5 MG PO TABS: 5.0000 mg | ORAL_TABLET | Freq: Every day | ORAL | 3 refills | Status: DC

## 2022-03-21 MED ORDER — ROSUVASTATIN CALCIUM 40 MG PO TABS: 40.0000 mg | ORAL_TABLET | Freq: Every day | ORAL | 3 refills | Status: DC

## 2022-03-21 NOTE — Addendum Note (Signed)
Addended by: Carter Kitten D on: 03/21/2022 10:16 AM   Modules accepted: Orders

## 2022-03-24 NOTE — Addendum Note (Signed)
Addended by: Penn Grissett, [place] F on: 03/24/2022 10:44 AM   Modules accepted: Orders  

## 2022-04-30 ENCOUNTER — Other Ambulatory Visit: Payer: Self-pay

## 2022-04-30 MED ORDER — METOPROLOL SUCCINATE ER 50 MG PO TB24: ORAL_TABLET | ORAL | 3 refills | Status: DC

## 2022-06-17 ENCOUNTER — Other Ambulatory Visit: Payer: Self-pay

## 2022-06-17 MED ORDER — APIXABAN 5 MG PO TABS: 5.0000 mg | ORAL_TABLET | Freq: Two times a day (BID) | ORAL | 1 refills | Status: DC

## 2022-06-17 NOTE — Telephone Encounter (Signed)
Prescription refill request for Eliquis received. Indication: Afib  Last office visit: 03/20/22 Lovena Le)  Scr: 0.93 (10/10/21)  Age: 67 Weight: 85.3kg  Appropriate dose and refill sent to requested pharmacy.

## 2022-11-17 ENCOUNTER — Other Ambulatory Visit: Payer: Self-pay

## 2022-11-17 MED ORDER — APIXABAN 5 MG PO TABS: 5.0000 mg | ORAL_TABLET | Freq: Two times a day (BID) | ORAL | 1 refills | Status: DC

## 2022-11-17 NOTE — Telephone Encounter (Signed)
Prescription refill request for Eliquis received. Indication:afib Last office visit:10/23 Scr:0.9 2023 Age: 67 Weight:85.3  kg  Prescription refilled

## 2023-02-02 ENCOUNTER — Other Ambulatory Visit: Payer: Self-pay

## 2023-02-02 DIAGNOSIS — I10 Essential (primary) hypertension: Secondary | ICD-10-CM

## 2023-02-02 MED ORDER — ROSUVASTATIN CALCIUM 40 MG PO TABS: 40.0000 mg | ORAL_TABLET | Freq: Every day | ORAL | 0 refills | Status: DC

## 2023-02-02 MED ORDER — LISINOPRIL 5 MG PO TABS: 5.0000 mg | ORAL_TABLET | Freq: Every day | ORAL | 0 refills | Status: DC

## 2023-02-02 MED ORDER — APIXABAN 5 MG PO TABS
5.0000 mg | ORAL_TABLET | Freq: Two times a day (BID) | ORAL | 1 refills | Status: DC
  Filled 2023-06-21 – 2023-06-26 (×2): qty 180, 90d supply, fill #0

## 2023-02-06 ENCOUNTER — Other Ambulatory Visit: Payer: Self-pay

## 2023-02-06 MED ORDER — METOPROLOL SUCCINATE ER 50 MG PO TB24: ORAL_TABLET | ORAL | 0 refills | Status: DC

## 2023-03-23 NOTE — Progress Notes (Unsigned)
  Cardiology Office Note:  .   Date:  03/23/2023  ID:  Charles Mills, DOB [birth year], MRN [id number] PCP: Patient, No Pcp Per  [facility] Providers Cardiologist:  None {  History of Present Illness: .   Charles Mills is a 67 y.o. male w/PMHx of HTN, HLD, permanent AFib.  He saw Dr. Ladona Ridgel 03/20/22, very active, playing tennis, pickle ball, feeling well, active/intentional weight loss. No changes were made.   Today's visit is scheduled as an annual visit  ROS: ***  *** symptoms, rates *** eliquis, dose, labs, bleeding *** labs, lipids   Studies Reviewed: Marland Kitchen    EKG done today and reviewed by myself:  ***   12/01/14: TTE Study Conclusions  - Left ventricle: The cavity size was normal. Wall thickness was    normal. Systolic function was normal. The estimated ejection    fraction was in the range of 55% to 60%.  - Left atrium: The atrium was moderately dilated.  - Right atrium: The atrium was mildly dilated.  - Atrial septum: No defect or patent foramen ovale was identified.  - Pericardium, extracardiac: A trivial pericardial effusion was    identified posterior to the heart.   Impressions:  - No cardiac source of embolism was identified, but cannot be ruled    out on the basis of this examination.    Risk Assessment/Calculations:    Physical Exam:   VS:  There were no vitals taken for this visit.   Wt Readings from Last 3 Encounters:  03/20/22 188 lb (85.3 kg)  03/26/21 198 lb 12.8 oz (90.2 kg)  03/08/21 196 lb (88.9 kg)    GEN: Well nourished, well developed in no acute distress NECK: No JVD; No carotid bruits CARDIAC: ***RRR, no murmurs, rubs, gallops RESPIRATORY:  *** CTA b/l without rales, wheezing or rhonchi  ABDOMEN: Soft, non-tender, non-distended EXTREMITIES:  No edema; No deformity   PPM/ICD/ILR site: *** is stable, no thinning, fluctuation, tethering  ASSESSMENT AND PLAN: .    permanent AFib CHA2DS2Vasc is 2, on Eliquis, *** appropriately  dosed *** rates ***   HTN ***  Secondary hypercoagulable state 2/2 AFib     {Are you ordering a CV Procedure (e.g. stress test, cath, DCCV, TEE, etc)?   Press F2        :[id number]}     Dispo: ***  Signed, Sheilah Pigeon, PA-C

## 2023-03-25 ENCOUNTER — Encounter: Payer: Self-pay | Admitting: Physician Assistant

## 2023-03-25 ENCOUNTER — Other Ambulatory Visit: Payer: Self-pay | Admitting: *Deleted

## 2023-03-25 ENCOUNTER — Ambulatory Visit: Payer: Medicare Other | Attending: Physician Assistant | Admitting: Physician Assistant

## 2023-03-25 VITALS — BP 156/84 | HR 82 | Ht 70.0 in | Wt 185.0 lb

## 2023-03-25 DIAGNOSIS — D6869 Other thrombophilia: Secondary | ICD-10-CM

## 2023-03-25 DIAGNOSIS — I10 Essential (primary) hypertension: Secondary | ICD-10-CM | POA: Diagnosis not present

## 2023-03-25 DIAGNOSIS — Z79899 Other long term (current) drug therapy: Secondary | ICD-10-CM | POA: Diagnosis not present

## 2023-03-25 DIAGNOSIS — E785 Hyperlipidemia, unspecified: Secondary | ICD-10-CM | POA: Diagnosis not present

## 2023-03-25 DIAGNOSIS — I4821 Permanent atrial fibrillation: Secondary | ICD-10-CM | POA: Diagnosis not present

## 2023-03-25 NOTE — Patient Instructions (Signed)
Medication Instructions:   Your physician recommends that you continue on your current medications as directed. Please refer to the Current Medication list given to you today.   *If you need a refill on your cardiac medications before your next appointment, please call your pharmacy*   Lab Work: CMET LIPIDS  CBC TODAY    If you have labs (blood work) drawn today and your tests are completely normal, you will receive your results only by: MyChart Message (if you have MyChart) OR A paper copy in the mail If you have any lab test that is abnormal or we need to change your treatment, we will call you to review the results.   Testing/Procedures: NONE ORDERED  TODAY      Follow-Up: At [facility], you and your health needs are our priority.  As part of our continuing mission to provide you with exceptional heart care, we have created designated Provider Care Teams.  These Care Teams include your primary Cardiologist (physician) and Advanced Practice Providers (APPs -  Physician Assistants and Nurse Practitioners) who all work together to provide you with the care you need, when you need it.  We recommend signing up for the patient portal called "MyChart".  Sign up information is provided on this After Visit Summary.  MyChart is used to connect with patients for Virtual Visits (Telemedicine).  Patients are able to view lab/test results, encounter notes, upcoming appointments, etc.  Non-urgent messages can be sent to your provider as well.   To learn more about what you can do with MyChart, go to [email or web address].    Your next appointment:   1 year(s)  Provider:   You may see Dr. Ladona Ridgel  or one of the following Advanced Practice Providers on your designated Care Team:   Francis Dowse, [place]  Other Instructions

## 2023-03-26 LAB — COMPREHENSIVE METABOLIC PANEL
ALT: 36 [IU]/L (ref 0–44)
AST: 38 [IU]/L (ref 0–40)
Albumin: 4.7 g/dL (ref 3.9–4.9)
Alkaline Phosphatase: 56 [IU]/L (ref 44–121)
BUN/Creatinine Ratio: 21 (ref 10–24)
BUN: 17 mg/dL (ref 8–27)
Bilirubin Total: 1 mg/dL (ref 0.0–1.2)
CO2: 23 mmol/L (ref 20–29)
Calcium: 9 mg/dL (ref 8.6–10.2)
Chloride: 100 mmol/L (ref 96–106)
Creatinine, Ser: 0.81 mg/dL (ref 0.76–1.27)
Globulin, Total: 2.3 g/dL (ref 1.5–4.5)
Glucose: 98 mg/dL (ref 70–99)
Potassium: 4.7 mmol/L (ref 3.5–5.2)
Sodium: 137 mmol/L (ref 134–144)
Total Protein: 7 g/dL (ref 6.0–8.5)
eGFR: 97 mL/min/{1.73_m2} (ref >59)

## 2023-03-26 LAB — LIPID PANEL
Chol/HDL Ratio: 2.4 {ratio} (ref 0.0–5.0)
Cholesterol, Total: 100 mg/dL (ref 100–199)
HDL: 42 mg/dL (ref >39)
LDL Chol Calc (NIH): 47 mg/dL (ref 0–99)
Triglycerides: 40 mg/dL (ref 0–149)
VLDL Cholesterol Cal: 11 mg/dL (ref 5–40)

## 2023-03-26 LAB — CBC
Hematocrit: 45 % (ref 37.5–51.0)
Hemoglobin: 14.3 g/dL (ref 13.0–17.7)
MCH: 29.9 pg (ref 26.6–33.0)
MCHC: 31.8 g/dL (ref 31.5–35.7)
MCV: 94 fL (ref 79–97)
Platelets: 233 10*3/uL (ref 150–450)
RBC: 4.79 x10E6/uL (ref 4.14–5.80)
RDW: 12.1 % (ref 11.6–15.4)
WBC: 7.9 10*3/uL (ref 3.4–10.8)

## 2023-04-20 ENCOUNTER — Other Ambulatory Visit: Payer: Self-pay | Admitting: Internal Medicine

## 2023-04-20 ENCOUNTER — Other Ambulatory Visit: Payer: Self-pay

## 2023-04-20 DIAGNOSIS — I10 Essential (primary) hypertension: Secondary | ICD-10-CM

## 2023-04-20 MED ORDER — METOPROLOL SUCCINATE ER 50 MG PO TB24: ORAL_TABLET | ORAL | 0 refills | Status: DC

## 2023-06-19 ENCOUNTER — Other Ambulatory Visit (HOSPITAL_COMMUNITY): Payer: Self-pay

## 2023-06-20 ENCOUNTER — Other Ambulatory Visit (HOSPITAL_COMMUNITY): Payer: Self-pay

## 2023-06-21 ENCOUNTER — Other Ambulatory Visit (HOSPITAL_COMMUNITY): Payer: Self-pay

## 2023-06-21 MED ORDER — APIXABAN 5 MG PO TABS
5.0000 mg | ORAL_TABLET | Freq: Two times a day (BID) | ORAL | 1 refills | Status: DC
  Filled 2023-09-19: qty 180, 90d supply, fill #0

## 2023-06-22 ENCOUNTER — Other Ambulatory Visit (HOSPITAL_COMMUNITY): Payer: Self-pay

## 2023-06-23 ENCOUNTER — Other Ambulatory Visit (HOSPITAL_COMMUNITY): Payer: Self-pay

## 2023-06-24 ENCOUNTER — Other Ambulatory Visit: Payer: Self-pay

## 2023-06-25 ENCOUNTER — Other Ambulatory Visit: Payer: Self-pay

## 2023-06-26 ENCOUNTER — Other Ambulatory Visit (HOSPITAL_COMMUNITY): Payer: Self-pay

## 2023-06-26 ENCOUNTER — Other Ambulatory Visit: Payer: Self-pay

## 2023-07-31 ENCOUNTER — Other Ambulatory Visit (HOSPITAL_BASED_OUTPATIENT_CLINIC_OR_DEPARTMENT_OTHER): Payer: Self-pay

## 2023-09-19 ENCOUNTER — Other Ambulatory Visit (HOSPITAL_COMMUNITY): Payer: Self-pay

## 2023-09-19 ENCOUNTER — Other Ambulatory Visit: Payer: Self-pay | Admitting: Internal Medicine

## 2023-09-19 ENCOUNTER — Other Ambulatory Visit (HOSPITAL_BASED_OUTPATIENT_CLINIC_OR_DEPARTMENT_OTHER): Payer: Self-pay

## 2023-09-19 DIAGNOSIS — I10 Essential (primary) hypertension: Secondary | ICD-10-CM

## 2023-09-21 ENCOUNTER — Other Ambulatory Visit: Payer: Self-pay

## 2023-09-21 ENCOUNTER — Other Ambulatory Visit: Payer: Self-pay | Admitting: Internal Medicine

## 2023-09-21 ENCOUNTER — Other Ambulatory Visit (HOSPITAL_COMMUNITY): Payer: Self-pay

## 2023-09-21 DIAGNOSIS — I10 Essential (primary) hypertension: Secondary | ICD-10-CM

## 2023-09-21 MED ORDER — LISINOPRIL 5 MG PO TABS
5.0000 mg | ORAL_TABLET | Freq: Every day | ORAL | 1 refills | Status: DC
  Filled 2023-11-23: qty 90, 90d supply, fill #0
  Filled 2024-01-31: qty 90, 90d supply, fill #1

## 2023-09-21 MED ORDER — METOPROLOL SUCCINATE ER 50 MG PO TB24: ORAL_TABLET | ORAL | 1 refills | Status: DC

## 2023-09-21 MED ORDER — ROSUVASTATIN CALCIUM 40 MG PO TABS
40.0000 mg | ORAL_TABLET | Freq: Every day | ORAL | 1 refills | Status: DC
  Filled 2023-11-23: qty 90, 90d supply, fill #0
  Filled 2023-11-24 – 2024-01-31 (×2): qty 90, 90d supply, fill #1

## 2023-09-22 ENCOUNTER — Other Ambulatory Visit (HOSPITAL_COMMUNITY): Payer: Self-pay

## 2023-09-22 ENCOUNTER — Other Ambulatory Visit: Payer: Self-pay

## 2023-09-22 MED ORDER — METOPROLOL SUCCINATE ER 50 MG PO TB24
50.0000 mg | ORAL_TABLET | Freq: Every day | ORAL | 1 refills | Status: DC
  Filled 2023-09-22: qty 90, 90d supply, fill #0
  Filled 2023-12-17: qty 90, 90d supply, fill #1

## 2023-09-23 ENCOUNTER — Other Ambulatory Visit: Payer: Self-pay

## 2023-09-26 ENCOUNTER — Other Ambulatory Visit (HOSPITAL_COMMUNITY): Payer: Self-pay

## 2023-10-26 ENCOUNTER — Other Ambulatory Visit (HOSPITAL_COMMUNITY): Payer: Self-pay

## 2023-11-23 ENCOUNTER — Other Ambulatory Visit (HOSPITAL_COMMUNITY): Payer: Self-pay

## 2023-11-23 ENCOUNTER — Other Ambulatory Visit: Payer: Self-pay

## 2023-11-24 ENCOUNTER — Other Ambulatory Visit: Payer: Self-pay

## 2023-11-26 ENCOUNTER — Other Ambulatory Visit: Payer: Self-pay

## 2023-12-16 ENCOUNTER — Other Ambulatory Visit: Payer: Self-pay | Admitting: Internal Medicine

## 2023-12-16 DIAGNOSIS — I4821 Permanent atrial fibrillation: Secondary | ICD-10-CM

## 2023-12-17 ENCOUNTER — Other Ambulatory Visit (HOSPITAL_COMMUNITY): Payer: Self-pay

## 2023-12-17 ENCOUNTER — Other Ambulatory Visit: Payer: Self-pay

## 2023-12-17 DIAGNOSIS — L209 Atopic dermatitis, unspecified: Secondary | ICD-10-CM | POA: Insufficient documentation

## 2023-12-17 MED ORDER — APIXABAN 5 MG PO TABS
5.0000 mg | ORAL_TABLET | Freq: Two times a day (BID) | ORAL | 1 refills | Status: DC
  Filled 2023-12-17: qty 180, 90d supply, fill #0
  Filled 2024-03-17: qty 180, 90d supply, fill #1

## 2023-12-17 NOTE — Telephone Encounter (Signed)
 Prescription refill request for Eliquis  received. Indication: a fib Last office visit: 03/25/23 Scr: 0.81 epic 03/25/23 Age: 68# Weight: 83kg

## 2024-01-31 ENCOUNTER — Other Ambulatory Visit: Payer: Self-pay | Admitting: Internal Medicine

## 2024-02-01 ENCOUNTER — Other Ambulatory Visit (HOSPITAL_COMMUNITY): Payer: Self-pay

## 2024-02-02 ENCOUNTER — Other Ambulatory Visit: Payer: Self-pay

## 2024-02-02 ENCOUNTER — Other Ambulatory Visit (HOSPITAL_COMMUNITY): Payer: Self-pay

## 2024-02-02 MED ORDER — METOPROLOL SUCCINATE ER 50 MG PO TB24
ORAL_TABLET | ORAL | 0 refills | Status: DC
  Filled 2024-02-02: qty 90, fill #0
  Filled 2024-03-17: qty 90, 90d supply, fill #0

## 2024-03-17 ENCOUNTER — Other Ambulatory Visit: Payer: Self-pay

## 2024-03-17 ENCOUNTER — Other Ambulatory Visit (HOSPITAL_COMMUNITY): Payer: Self-pay

## 2024-04-22 NOTE — Progress Notes (Signed)
  Cardiology Office Note:  .   Date:  04/22/2024  ID:  Nancyann Sor, DOB 01-20-1956, MRN [id number] PCP: Patient, No Pcp Per  [facility] Providers Cardiologist: Dr. Waddell {  History of Present Illness: .   Lynard Postlewait is a 68 y.o. male w/PMHx of HTN, HLD, permanent AFib.  He saw Dr. Waddell 03/20/22, very active, playing tennis, pickle ball, feeling well, active/intentional weight loss. No changes were made.  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 No CP, palpitations cardiac awareness No exertional intolerances No SOB, DOE No near syncope or syncope No bleeding or signs of bleeding He does not currently have a PMD, advised to get one for screening/well care and any non-cardiac needs No changes mad, planned for labs, including lipids    Today's visit is scheduled as an annual visit ROS:   He continues to do very well Very active, plays pickle ball regularly also doing [place] [employer] No CP, palpitations or cardiac awareness No exertional intolerances No SOB, DOE No near syncope or syncope No bleeding or signs of bleeding  He does not currently have a PMD, discussed again today to get one for screening/well care and any non-cardiac needs   Studies Reviewed: SABRA    EKG done today and reviewed by myself:  AFib 99bpm, no significant changes   12/01/14: TTE Study Conclusions  - Left ventricle: The cavity size was normal. Wall thickness was    normal. Systolic function was normal. The estimated ejection    fraction was in the range of 55% to 60%.  - Left atrium: The atrium was moderately dilated.  - Right atrium: The atrium was mildly dilated.  - Atrial septum: No defect or patent foramen ovale was identified.  - Pericardium, extracardiac: A trivial pericardial effusion was    identified posterior to the heart.   Impressions:  - No cardiac source of embolism was identified, but cannot be ruled    out  on the basis of this examination.    Risk Assessment/Calculations:    Physical Exam:   VS:  There were no vitals taken for this visit.   Wt Readings from Last 3 Encounters:  03/25/23 185 lb (83.9 kg)  03/20/22 188 lb (85.3 kg)  03/26/21 198 lb 12.8 oz (90.2 kg)    GEN: Well nourished, well developed in no acute distress NECK: No JVD; No carotid bruits CARDIAC:  irreg-irreg, no murmurs, rubs, gallops RESPIRATORY: CTA b/l without rales, wheezing or rhonchi  ABDOMEN: Soft, non-tender, non-distended EXTREMITIES: No edema; No deformity    ASSESSMENT AND PLAN: .    permanent AFib CHA2DS2Vasc is 2, on Eliquis , appropriately dosed controlled rates, home rates/watch 60's-80's asymptomatic   HTN Generally better at home  Secondary hypercoagulable state 2/2 AFib  4. 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 We discussed need for PMD, and recommend general cardiologist going forward.  He is in agreement.    Dispo: refer to general cardiology, the [place] location would be most convenient for him, annual visits, sooner if needed   Signed, Charlies Macario Arthur, PA-C

## 2024-04-27 ENCOUNTER — Other Ambulatory Visit: Payer: Self-pay | Admitting: Internal Medicine

## 2024-04-27 ENCOUNTER — Ambulatory Visit: Attending: Physician Assistant | Admitting: Physician Assistant

## 2024-04-27 VITALS — BP 142/82 | HR 99 | Ht 70.0 in | Wt 185.0 lb

## 2024-04-27 DIAGNOSIS — I10 Essential (primary) hypertension: Secondary | ICD-10-CM

## 2024-04-27 DIAGNOSIS — E785 Hyperlipidemia, unspecified: Secondary | ICD-10-CM

## 2024-04-27 DIAGNOSIS — D6869 Other thrombophilia: Secondary | ICD-10-CM

## 2024-04-27 DIAGNOSIS — I4821 Permanent atrial fibrillation: Secondary | ICD-10-CM | POA: Diagnosis not present

## 2024-04-27 NOTE — Patient Instructions (Signed)
 Medication Instructions:   Your physician recommends that you continue on your current medications as directed. Please refer to the Current Medication list given to you today.   *If you need a refill on your cardiac medications before your next appointment, please call your pharmacy*   Lab Work:  PLEASE GO DOWN STAIRS  [facility]  FIRST FLOOR   ( GET OFF ELEVATORS WALK TOWARDS WAITING AREA LAB LOCATED BY PHARMACY):   CMET  LIPIDS  CBC      If you have labs (blood work) drawn today and your tests are completely normal, you will receive your results only by: MyChart Message (if you have MyChart) OR A paper copy in the mail If you have any lab test that is abnormal or we need to change your treatment, we will call you to review the results.    Testing/Procedures: NONE ORDERED  TODAY     Follow-Up: At [facility] you and your health needs are our priority.  As part of our continuing mission to provide you with exceptional heart care, our providers are all part of one team.  This team includes your primary Cardiologist (physician) and Advanced Practice Providers or APPs (Physician Assistants and Nurse Practitioners) who all work together to provide you with the care you need, when you need it.   Your next appointment:   1 year(s)  Provider:    Annabella Scarce, MD or Reche Finder, NP    We recommend signing up for the patient portal called MyChart.  Sign up information is provided on this After Visit Summary.  MyChart is used to connect with patients for Virtual Visits (Telemedicine).  Patients are able to view lab/test results, encounter notes, upcoming appointments, etc.  Non-urgent messages can be sent to your provider as well.   To learn more about what you can do with MyChart, go to [email or web address].   Other Instructions

## 2024-04-28 ENCOUNTER — Ambulatory Visit: Payer: Self-pay | Admitting: Physician Assistant

## 2024-04-28 LAB — CBC
Hematocrit: 42 % (ref 37.5–51.0)
Hemoglobin: 13.3 g/dL (ref 13.0–17.7)
MCH: 29.6 pg (ref 26.6–33.0)
MCHC: 31.7 g/dL (ref 31.5–35.7)
MCV: 93 fL (ref 79–97)
Platelets: 283 x10E3/uL (ref 150–450)
RBC: 4.5 x10E6/uL (ref 4.14–5.80)
RDW: 11.7 % (ref 11.6–15.4)
WBC: 8.3 x10E3/uL (ref 3.4–10.8)

## 2024-04-28 LAB — COMPREHENSIVE METABOLIC PANEL WITH GFR
ALT: 33 IU/L (ref 0–44)
AST: 30 IU/L (ref 0–40)
Albumin: 4.4 g/dL (ref 3.9–4.9)
Alkaline Phosphatase: 54 IU/L (ref 47–123)
BUN/Creatinine Ratio: 21 (ref 10–24)
BUN: 16 mg/dL (ref 8–27)
Bilirubin Total: 0.7 mg/dL (ref 0.0–1.2)
CO2: 24 mmol/L (ref 20–29)
Calcium: 9.1 mg/dL (ref 8.6–10.2)
Chloride: 101 mmol/L (ref 96–106)
Creatinine, Ser: 0.78 mg/dL (ref 0.76–1.27)
Globulin, Total: 2.3 g/dL (ref 1.5–4.5)
Glucose: 99 mg/dL (ref 70–99)
Potassium: 5.1 mmol/L (ref 3.5–5.2)
Sodium: 137 mmol/L (ref 134–144)
Total Protein: 6.7 g/dL (ref 6.0–8.5)
eGFR: 97 mL/min/1.73 (ref >59)

## 2024-04-28 LAB — LIPID PANEL
Chol/HDL Ratio: 2.5 ratio (ref 0.0–5.0)
Cholesterol, Total: 110 mg/dL (ref 100–199)
HDL: 44 mg/dL (ref >39)
LDL Chol Calc (NIH): 56 mg/dL (ref 0–99)
Triglycerides: 34 mg/dL (ref 0–149)
VLDL Cholesterol Cal: 10 mg/dL (ref 5–40)

## 2024-04-29 ENCOUNTER — Other Ambulatory Visit (HOSPITAL_COMMUNITY): Payer: Self-pay

## 2024-04-29 MED ORDER — ROSUVASTATIN CALCIUM 40 MG PO TABS
40.0000 mg | ORAL_TABLET | Freq: Every day | ORAL | 3 refills | Status: DC
  Filled 2024-04-29: qty 90, 90d supply, fill #0
  Filled 2024-06-08 – 2024-06-11 (×2): qty 90, 90d supply, fill #1

## 2024-05-15 ENCOUNTER — Other Ambulatory Visit: Payer: Self-pay | Admitting: Internal Medicine

## 2024-05-15 DIAGNOSIS — I10 Essential (primary) hypertension: Secondary | ICD-10-CM

## 2024-05-16 ENCOUNTER — Other Ambulatory Visit (HOSPITAL_COMMUNITY): Payer: Self-pay

## 2024-05-16 MED ORDER — LISINOPRIL 5 MG PO TABS
5.0000 mg | ORAL_TABLET | Freq: Every day | ORAL | 3 refills | Status: DC
  Filled 2024-05-16: qty 90, 90d supply, fill #0

## 2024-06-08 ENCOUNTER — Other Ambulatory Visit: Payer: Self-pay | Admitting: Internal Medicine

## 2024-06-08 ENCOUNTER — Other Ambulatory Visit (HOSPITAL_COMMUNITY): Payer: Self-pay

## 2024-06-10 ENCOUNTER — Other Ambulatory Visit (HOSPITAL_BASED_OUTPATIENT_CLINIC_OR_DEPARTMENT_OTHER): Payer: Self-pay

## 2024-06-10 ENCOUNTER — Other Ambulatory Visit: Payer: Self-pay

## 2024-06-10 ENCOUNTER — Other Ambulatory Visit (HOSPITAL_COMMUNITY): Payer: Self-pay

## 2024-06-10 MED ORDER — METOPROLOL SUCCINATE ER 50 MG PO TB24
50.0000 mg | ORAL_TABLET | Freq: Every day | ORAL | 3 refills | Status: DC
  Filled 2024-06-10: qty 90, 90d supply, fill #0

## 2024-06-11 ENCOUNTER — Other Ambulatory Visit: Payer: Self-pay | Admitting: Internal Medicine

## 2024-06-11 DIAGNOSIS — I4821 Permanent atrial fibrillation: Secondary | ICD-10-CM

## 2024-06-13 ENCOUNTER — Other Ambulatory Visit: Payer: Self-pay

## 2024-06-13 ENCOUNTER — Other Ambulatory Visit (HOSPITAL_COMMUNITY): Payer: Self-pay

## 2024-06-13 MED ORDER — APIXABAN 5 MG PO TABS
5.0000 mg | ORAL_TABLET | Freq: Two times a day (BID) | ORAL | 1 refills | Status: DC
  Filled 2024-06-13: qty 180, 90d supply, fill #0

## 2024-06-13 NOTE — Telephone Encounter (Signed)
 Prescription refill request for Eliquis  received. Indication:afib Last office visit:11/25 Scr: 0.78  11/25 Age:68 Weight:83.9  kg  Prescription refilled

## 2024-06-15 ENCOUNTER — Other Ambulatory Visit: Payer: Self-pay

## 2024-07-18 ENCOUNTER — Telehealth: Payer: Self-pay | Admitting: Physician Assistant

## 2024-07-18 DIAGNOSIS — I4821 Permanent atrial fibrillation: Secondary | ICD-10-CM

## 2024-07-18 DIAGNOSIS — I10 Essential (primary) hypertension: Secondary | ICD-10-CM

## 2024-07-18 MED ORDER — ROSUVASTATIN CALCIUM 40 MG PO TABS: 40.0000 mg | ORAL_TABLET | Freq: Every day | ORAL | 3 refills | Status: AC

## 2024-07-18 MED ORDER — METOPROLOL SUCCINATE ER 50 MG PO TB24: 50.0000 mg | ORAL_TABLET | Freq: Every day | ORAL | 3 refills | Status: AC

## 2024-07-18 MED ORDER — APIXABAN 5 MG PO TABS: 5.0000 mg | ORAL_TABLET | Freq: Two times a day (BID) | ORAL | 1 refills | Status: AC

## 2024-07-18 MED ORDER — NITROGLYCERIN 0.4 MG SL SUBL: 0.4000 mg | SUBLINGUAL_TABLET | SUBLINGUAL | 3 refills | Status: AC | PRN

## 2024-07-18 MED ORDER — LISINOPRIL 5 MG PO TABS: 5.0000 mg | ORAL_TABLET | Freq: Every day | ORAL | 3 refills | Status: AC
